# Patient Record
Sex: Male | Born: 1980 | Race: White | Hispanic: Yes | Smoking: Current every day smoker
Health system: Southern US, Community
[De-identification: ages and names within clinical notes are randomized; demographics above are authoritative.]

## PROBLEM LIST (undated history)

## (undated) DIAGNOSIS — K651 Peritoneal abscess: Secondary | ICD-10-CM

## (undated) DIAGNOSIS — Z227 Latent tuberculosis: Secondary | ICD-10-CM

## (undated) HISTORY — PX: NO PAST SURGERIES: SHX2092

---

## 2004-11-14 ENCOUNTER — Emergency Department (HOSPITAL_COMMUNITY): Admission: EM | Admit: 2004-11-14 | Discharge: 2004-11-14 | Payer: Self-pay | Admitting: Emergency Medicine
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
## 2017-11-01 DIAGNOSIS — Z227 Latent tuberculosis: Secondary | ICD-10-CM

## 2017-11-01 HISTORY — DX: Latent tuberculosis: Z22.7

## 2018-04-03 DIAGNOSIS — K651 Peritoneal abscess: Secondary | ICD-10-CM

## 2018-04-03 HISTORY — DX: Peritoneal abscess: K65.1

## 2018-04-12 ENCOUNTER — Inpatient Hospital Stay (HOSPITAL_COMMUNITY)
Admission: EM | Admit: 2018-04-12 | Discharge: 2018-04-16 | DRG: 153 | Disposition: A | Discharge status: Home / Self Care | Payer: Self-pay | Source: Other Acute Inpatient Hospital | Attending: Family Medicine | Admitting: Family Medicine

## 2018-04-12 ENCOUNTER — Encounter (HOSPITAL_COMMUNITY): Payer: Self-pay | Admitting: Emergency Medicine

## 2018-04-12 ENCOUNTER — Emergency Department (HOSPITAL_COMMUNITY): Payer: Self-pay

## 2018-04-12 ENCOUNTER — Other Ambulatory Visit: Payer: Self-pay

## 2018-04-12 DIAGNOSIS — K651 Peritoneal abscess: Secondary | ICD-10-CM

## 2018-04-12 DIAGNOSIS — Z23 Encounter for immunization: Secondary | ICD-10-CM

## 2018-04-12 DIAGNOSIS — J36 Peritonsillar abscess: Principal | ICD-10-CM | POA: Diagnosis present

## 2018-04-12 DIAGNOSIS — F17210 Nicotine dependence, cigarettes, uncomplicated: Secondary | ICD-10-CM | POA: Diagnosis present

## 2018-04-12 DIAGNOSIS — D72829 Elevated white blood cell count, unspecified: Secondary | ICD-10-CM | POA: Diagnosis not present

## 2018-04-12 DIAGNOSIS — M542 Cervicalgia: Secondary | ICD-10-CM | POA: Diagnosis not present

## 2018-04-12 DIAGNOSIS — M272 Inflammatory conditions of jaws: Secondary | ICD-10-CM | POA: Diagnosis present

## 2018-04-12 DIAGNOSIS — R0789 Other chest pain: Secondary | ICD-10-CM | POA: Diagnosis not present

## 2018-04-12 HISTORY — DX: Peritoneal abscess: K65.1

## 2018-04-12 HISTORY — DX: Latent tuberculosis: Z22.7

## 2018-04-12 LAB — BASIC METABOLIC PANEL
ANION GAP: 11 (ref 5–15)
BUN: 18 mg/dL (ref 6–20)
CALCIUM: 9.4 mg/dL (ref 8.9–10.3)
CO2: 21 mmol/L — ABNORMAL LOW (ref 22–32)
Chloride: 106 mmol/L (ref 98–111)
Creatinine, Ser: 1.14 mg/dL (ref 0.61–1.24)
GFR calc Af Amer: >60 mL/min (ref >60)
GFR calc non Af Amer: >60 mL/min (ref >60)
GLUCOSE: 116 mg/dL — AB (ref 70–99)
Potassium: 4 mmol/L (ref 3.5–5.1)
Sodium: 138 mmol/L (ref 135–145)

## 2018-04-12 LAB — CBC WITH DIFFERENTIAL/PLATELET
Abs Immature Granulocytes: 0.1 10*3/uL (ref 0.0–0.1)
BASOS ABS: 0.1 10*3/uL (ref 0.0–0.1)
BASOS PCT: 0 %
EOS ABS: 0 10*3/uL (ref 0.0–0.7)
Eosinophils Relative: 0 %
HCT: 48.6 % (ref 39.0–52.0)
Hemoglobin: 16.7 g/dL (ref 13.0–17.0)
IMMATURE GRANULOCYTES: 0 %
Lymphocytes Relative: 11 %
Lymphs Abs: 1.4 10*3/uL (ref 0.7–4.0)
MCH: 30.9 pg (ref 26.0–34.0)
MCHC: 34.4 g/dL (ref 30.0–36.0)
MCV: 90 fL (ref 78.0–100.0)
MONOS PCT: 3 %
Monocytes Absolute: 0.4 10*3/uL (ref 0.1–1.0)
NEUTROS PCT: 86 %
Neutro Abs: 11.1 10*3/uL — ABNORMAL HIGH (ref 1.7–7.7)
Platelets: 315 10*3/uL (ref 150–400)
RBC: 5.4 MIL/uL (ref 4.22–5.81)
RDW: 11.9 % (ref 11.5–15.5)
WBC: 13 10*3/uL — ABNORMAL HIGH (ref 4.0–10.5)

## 2018-04-12 MED ORDER — SODIUM CHLORIDE 0.9 % IV BOLUS
1000.0000 mL | Freq: Once | INTRAVENOUS | Status: AC
  Administered 2018-04-12: 1000 mL via INTRAVENOUS

## 2018-04-12 MED ORDER — ENOXAPARIN SODIUM 40 MG/0.4ML ~~LOC~~ SOLN
40.0000 mg | SUBCUTANEOUS | Status: DC
  Administered 2018-04-12 – 2018-04-15 (×3): 40 mg via SUBCUTANEOUS
  Filled 2018-04-12 (×3): qty 0.4

## 2018-04-12 MED ORDER — DEXAMETHASONE SODIUM PHOSPHATE 10 MG/ML IJ SOLN
10.0000 mg | Freq: Four times a day (QID) | INTRAMUSCULAR | Status: AC
  Administered 2018-04-12 – 2018-04-13 (×4): 10 mg via INTRAVENOUS
  Filled 2018-04-12 (×4): qty 1

## 2018-04-12 MED ORDER — ONDANSETRON HCL 4 MG PO TABS: 4.0000 mg | ORAL_TABLET | Freq: Four times a day (QID) | ORAL | Status: DC | PRN

## 2018-04-12 MED ORDER — ACETAMINOPHEN 325 MG PO TABS: 650.0000 mg | ORAL_TABLET | Freq: Four times a day (QID) | ORAL | Status: DC | PRN

## 2018-04-12 MED ORDER — SODIUM CHLORIDE 0.9 % IV SOLN
INTRAVENOUS | Status: DC
  Administered 2018-04-12 – 2018-04-14 (×5): via INTRAVENOUS

## 2018-04-12 MED ORDER — INFLUENZA VAC SPLIT QUAD 0.5 ML IM SUSY
0.5000 mL | PREFILLED_SYRINGE | INTRAMUSCULAR | Status: AC
  Administered 2018-04-14: 0.5 mL via INTRAMUSCULAR
  Filled 2018-04-12: qty 0.5

## 2018-04-12 MED ORDER — IBUPROFEN 600 MG PO TABS
600.0000 mg | ORAL_TABLET | Freq: Four times a day (QID) | ORAL | Status: DC | PRN
  Administered 2018-04-12 – 2018-04-13 (×2): 600 mg via ORAL
  Filled 2018-04-12 (×2): qty 1

## 2018-04-12 MED ORDER — IOHEXOL 300 MG/ML  SOLN
75.0000 mL | Freq: Once | INTRAMUSCULAR | Status: AC | PRN
  Administered 2018-04-12: 75 mL via INTRAVENOUS

## 2018-04-12 MED ORDER — CLINDAMYCIN PHOSPHATE 600 MG/50ML IV SOLN
600.0000 mg | Freq: Three times a day (TID) | INTRAVENOUS | Status: DC
  Administered 2018-04-12 – 2018-04-16 (×11): 600 mg via INTRAVENOUS
  Filled 2018-04-12 (×14): qty 50

## 2018-04-12 MED ORDER — POLYETHYLENE GLYCOL 3350 17 G PO PACK
17.0000 g | PACK | Freq: Every day | ORAL | Status: DC | PRN
  Administered 2018-04-13: 17 g via ORAL
  Filled 2018-04-12 (×2): qty 1

## 2018-04-12 MED ORDER — DEXAMETHASONE SODIUM PHOSPHATE 10 MG/ML IJ SOLN
10.0000 mg | Freq: Once | INTRAMUSCULAR | Status: AC
  Administered 2018-04-12: 10 mg via INTRAVENOUS
  Filled 2018-04-12: qty 1

## 2018-04-12 MED ORDER — ACETAMINOPHEN 650 MG RE SUPP: 650.0000 mg | Freq: Four times a day (QID) | RECTAL | Status: DC | PRN

## 2018-04-12 MED ORDER — LIDOCAINE-EPINEPHRINE 1 %-1:100000 IJ SOLN
10.0000 mL | Freq: Once | INTRAMUSCULAR | Status: AC
  Administered 2018-04-12: 10 mL
  Filled 2018-04-12: qty 10

## 2018-04-12 MED ORDER — CLINDAMYCIN PHOSPHATE 900 MG/50ML IV SOLN
900.0000 mg | Freq: Once | INTRAVENOUS | Status: AC
  Administered 2018-04-12: 900 mg via INTRAVENOUS
  Filled 2018-04-12: qty 50

## 2018-04-12 MED ORDER — ONDANSETRON HCL 4 MG/2ML IJ SOLN
4.0000 mg | Freq: Four times a day (QID) | INTRAMUSCULAR | Status: DC | PRN
  Administered 2018-04-16: 4 mg via INTRAVENOUS
  Filled 2018-04-12: qty 2

## 2018-04-12 NOTE — ED Notes (Signed)
ENT at bedside

## 2018-04-12 NOTE — ED Notes (Signed)
Patient transported to CT 

## 2018-04-12 NOTE — ED Provider Notes (Signed)
[facility] EMERGENCY DEPARTMENT Provider Note   CSN: [id number] Arrival date & time: 04/12/18  6578     History   Chief Complaint Chief Complaint  Patient presents with  . Peritonsalor Abscess    HPI Todd Gray is a 37 y.o. male.  Patient was brought in by [facility] from [facility] rocking him the [facility] in [place].  Patient had been accepted by Dr. Jearld Fenton from ear nose and throat for peritonsillar abscess.  Patient has had symptoms for 3 days to include fever sore throat difficulty speaking.  Patient arrived stable from the transfer.     History reviewed. No pertinent past medical history.  There are no active problems to display for this patient.   History reviewed. No pertinent surgical history.      Home Medications    Prior to Admission medications   Not on File    Family History No family history on file.  Social History Social History   Tobacco Use  . Smoking status: Current Every Day Smoker  . Smokeless tobacco: Never Used  Substance Use Topics  . Alcohol use: Yes  . Drug use: Not on file     Allergies   Patient has no known allergies.   Review of Systems Review of Systems  Constitutional: Positive for fever.  HENT: Positive for sore throat and trouble swallowing. Negative for dental problem.   Eyes: Negative for redness.  Respiratory: Negative for shortness of breath.   Cardiovascular: Negative for chest pain.  Gastrointestinal: Negative for abdominal pain.  Genitourinary: Negative for dysuria.  Musculoskeletal: Negative for back pain.  Skin: Negative for rash.  Neurological: Negative for headaches.  Hematological: Does not bruise/bleed easily.  Psychiatric/Behavioral: Negative for confusion.     Physical Exam Updated Vital Signs BP 122/79   Pulse 71   Temp 97.9 F (36.6 C) (Oral)   Resp 20   SpO2 96%   Physical Exam  Constitutional: He is oriented to person, place, and time. He appears well-developed and  well-nourished. No distress.  HENT:  Head: Normocephalic and atraumatic.  Swelling to the soft palate on the right side.  Tongue pushed up a little bit on the right side complete examination of the pharynx little difficult.  Uvula does appear shifted towards the left.  Eyes: Pupils are equal, round, and reactive to light. Conjunctivae and EOM are normal.  Neck: Neck supple.  Cardiovascular: Normal rate and regular rhythm.  Pulmonary/Chest: Effort normal and breath sounds normal. No respiratory distress.  Abdominal: Soft. Bowel sounds are normal. There is no tenderness.  Musculoskeletal: Normal range of motion. He exhibits no edema.  Neurological: He is alert and oriented to person, place, and time. No cranial nerve deficit or sensory deficit. He exhibits normal muscle tone. Coordination normal.  Skin: Skin is warm.  Nursing note and vitals reviewed.    ED Treatments / Results  Labs (all labs ordered are listed, but only abnormal results are displayed) Labs Reviewed  CBC WITH DIFFERENTIAL/PLATELET - Abnormal; Notable for the following components:      Result Value   WBC 13.0 (*)    Neutro Abs 11.1 (*)    All other components within normal limits  BASIC METABOLIC PANEL - Abnormal; Notable for the following components:   CO2 21 (*)    Glucose, Bld 116 (*)    All other components within normal limits    EKG None  Radiology Ct Maxillofacial W Contrast  Result Date: 04/12/2018 CLINICAL DATA:  Initial  evaluation for maxillofacial swelling, abscess. EXAM: CT MAXILLOFACIAL WITH CONTRAST TECHNIQUE: Multidetector CT imaging of the maxillofacial structures was performed with intravenous contrast. Multiplanar CT image reconstructions were also generated. CONTRAST:  50mL OMNIPAQUE IOHEXOL 300 MG/ML  SOLN COMPARISON:  None. FINDINGS: Osseous: No acute osseous abnormality within the face. Visualized dentition within normal limits. Orbits: Globes and orbital soft tissues within normal limits.  Sinuses: Left maxillary sinus retention cyst. Paranasal sinuses otherwise clear. Mastoid air cells and middle ear cavities are well pneumatized and free of fluid. Soft tissues: Oral cavity within normal limits. Asymmetric enlargement of the right palatine tonsil, consistent with acute tonsillitis. Irregular hypodense collection measuring 13 x 17 x 14 mm consistent with tonsillar/peritonsillar abscess (series 4, image 33). Additional smaller component posteriorly measures 7 mm (series 4, image 36), likely contiguous. Associated swelling with inflammatory stranding within the adjacent right parapharyngeal fat. No retropharyngeal collection. Left oropharynx within normal limits. Mild asymmetric prominence of right level II lymph nodes, likely reactive. Limited intracranial: Subcentimeter parenchymal calcification at the anterior left frontal lobe. Probable dilated perivascular space at the inferior right basal ganglia. Visualized portions of the brain otherwise unremarkable. IMPRESSION: Findings consistent with acute right tonsillitis. Associated 13 x 17 x 14 mm and 7 mm tonsillar/peritonsillar abscesses as above. Electronically Signed   By: Rise Mu M.D.   On: 04/12/2018 13:20    Procedures Procedures (including critical care time)  Medications Ordered in ED Medications  0.9 %  sodium chloride infusion ( Intravenous New Bag/Given 04/12/18 1133)  lidocaine-EPINEPHrine (XYLOCAINE W/EPI) 1 %-1:100000 (with pres) injection 10 mL (has no administration in time range)  sodium chloride 0.9 % bolus 1,000 mL (0 mLs Intravenous Stopped 04/12/18 1300)  clindamycin (CLEOCIN) IVPB 900 mg (0 mg Intravenous Stopped 04/12/18 1212)  dexamethasone (DECADRON) injection 10 mg (10 mg Intravenous Given 04/12/18 1133)  iohexol (OMNIPAQUE) 300 MG/ML solution 75 mL (75 mLs Intravenous Contrast Given 04/12/18 1304)     Initial Impression / Assessment and Plan / ED Course  I have reviewed the triage vital signs and the  nursing notes.  Pertinent labs & imaging results that were available during my care of the patient were reviewed by me and considered in my medical decision making (see chart for details).     Contacted on-call ear nose and throat.  Dr. Jearld Fenton is no longer on call.  Is Dr. Delight Hoh.  She came in and saw the patient felt that it was more of an odontogenic source and not tonsillar source.  I therefore contacted Dr. drape from oral surgery.  His request since things were now getting confusing about the source did not labs started an IV give some IV fluids and ordered CT maxillofacial with contrast.  Patient's initial CT had been done up in the [place] area.  Showed about a 2 cm abscess that they felt was at the base of the tonsil on the right side and involving the base of the tongue.  Repeat studies patient had similar findings.  Oral surgery felt that it was not tooth related.  ENT recontacted.  ENT drain the abscess they still think it is possibly tooth related.  Patient also was given a dose of Decadron and Cleocin by me when I got oral surgery involved.  ENT has once patient admitted by the the unassigned medicine service.  It appears that this could be family medicine.  They want observation overnight.  Patient has remained stable.  Final Clinical Impressions(s) / ED Diagnoses   Final diagnoses:  Peritoneal abscess [facility])    ED Discharge Orders    None       Vanetta Mulders, MD 04/12/18 1739

## 2018-04-12 NOTE — Consult Note (Addendum)
[facility] OTOLARYNGOLOGY CONSULTATION  Referring Physician: Dr. Deretha Emory Primary Care Physician: Patient, No Pcp Per Patient Location at Initial Consult: Emergency Department Chief Complaint/Reason for Consult: oropharyngeal abscess  History of Presenting Illness:  History obtained from patient, girlfriend, and EMR.  Todd Gray is a  37 y.o. male presenting with throat pain. This began acutely on 04/09/18. No prior tonsillar issues or issues with recurrent strep in the past. He is tolerating his own secretions. No shortness of breath. Is able to lie flat. Has not been on abx. Strep testing at OSH was negative. +febrile at home. +malaise  Here today with girlfriend. [employer] interpreter used for this entire visit.   History reviewed. No pertinent past medical history.  History reviewed. No pertinent surgical history.  No family history on file. No bleeding disorders or anesthesia reactions.   Social History   Socioeconomic History  . Marital status: Married    Spouse name: Not on file  . Number of children: Not on file  . Years of education: Not on file  . Highest education level: Not on file  Occupational History  . Not on file  Social Needs  . Financial resource strain: Not on file  . Food insecurity:    Worry: Not on file    Inability: Not on file  . Transportation needs:    Medical: Not on file    Non-medical: Not on file  Tobacco Use  . Smoking status: Current Every Day Smoker  . Smokeless tobacco: Never Used  Substance and Sexual Activity  . Alcohol use: Yes  . Drug use: Not on file  . Sexual activity: Not on file  Lifestyle  . Physical activity:    Days per week: Not on file    Minutes per session: Not on file  . Stress: Not on file  Relationships  . Social connections:    Talks on phone: Not on file    Gets together: Not on file    Attends religious service: Not on file    Active member of club or organization: Not on file   Attends meetings of clubs or organizations: Not on file    Relationship status: Not on file  Other Topics Concern  . Not on file  Social History Narrative  . Not on file    No current facility-administered medications on file prior to encounter.    No current outpatient medications on file prior to encounter.    No Known Allergies   Review of Systems: Complete, all negative except for above HPI.    OBJECTIVE: Vital Signs: Vitals:   04/12/18 0908 04/12/18 1130  BP: 112/87 122/79  Pulse: 80 71  Resp: 20 20  Temp: 97.9 F (36.6 C)   SpO2: 96% 96%    I&O No intake or output data in the 24 hours ending 04/12/18 1217  Physical Exam General: Well developed, well nourished. No acute distress. Voice strong, no hot potato quality  Head/Face: Normocephalic, atraumatic. No scars or lesions. No sinus tenderness. Facial nerve intact and equal bilaterally.  No facial lacerations. Salivary glands non tender and without palpable masses  Eyes: Globes well positioned, no proptosis Lids: No periorbital edema/ecchymosis. No lid laceration Conjunctiva: No chemosis, hemorrhage PERRl, EOMI  Ears: No gross deformity. Normal external canal. Tympanic membrane intact bilaterally, no MEE  Hearing: Normal speech reception.  Nose: No gross deformity or lesions. No purulent discharge. Septum midline. No turbinate hypertrophy.  Mouth/Oropharynx: Lips without any lesions. Dentition fair, several  molars with fillings. Tenderness along right posterior lingual surface of mandible. No peritonsillar fluctuance. +mild uvular hydrops and soft palate edema.   Uvula midline. Tongue midline without lesions.  Neck: Trachea midline. No masses. No thyromegaly or nodules palpated. No crepitus.  Lymphatic: Mild right submandibular lymphadenopathy in the neck, soft, mildly swollen.  Respiratory: No stridor or distress.  Cardiovascular: Regular rate and rhythm.  Extremities: No edema or cyanosis. Warm and  well-perfused.  Skin: No scars or lesions on face or neck.  Neurologic: CN II-XII intact. Moving all extremities without gross abnormality.  Other:      Labs: Lab Results  Component Value Date   WBC 13.0 (H) 04/12/2018   HGB 16.7 04/12/2018   HCT 48.6 04/12/2018   PLT 315 04/12/2018   NA 138 04/12/2018   K 4.0 04/12/2018   CL 106 04/12/2018   CREATININE 1.14 04/12/2018   BUN 18 04/12/2018   CO2 21 (L) 04/12/2018     Review of Ancillary Data / Diagnostic Tests: CT neck reviewed in Radiology Reading Room, discussed with Radiology Dr. Chestine Spore, and with ED Physician- Dr. Deretha Emory. My interpretation of this study is there is a posterior- lateral tongue fluid collection contiguous with a periapical lucency in a right 3rd mandibular molar and an adjacent subperiosteal abscess.      ASSESSMENT:  36 y.o. male with right odontogenic abscess associated with mandibular third molar.   RECOMMENDATIONS: -IV decadron -IV Clindamycin -Consult OMFS for removal and drainage   OMFS was consulted and did not have any procedures to offer at this time.   Incision and Drainage Procedure Note  Pre-operative Diagnosis: Right oropharyngeal infection  Post-operative Diagnosis: same  Indications: Fluid collection in right base of tongue/tonsil with minimally rim enhancing component adjacent to the right mandibular third molar.   Anesthesia: 1% lidocaine with epinephrine, Hurricaine spray  Procedure Details  The procedure, risks and complications have been discussed in detail (including, but not limited to airway compromise, infection, bleeding) with the patient, and the patient has signed consent to the procedure.  TAfter adequate local anesthesia, needle localization with a 23g was attempted in multiple locations aroudn the tonsillar and peritonsillar region. Purulent drainage: absent The patient was observed until stable.  Findings: No purulence within the tonsil or peritonsillar regions  identified.  EBL: 7 cc's   Condition: Tolerated procedure well and Stable   Complications: none.   Would recommend admitting for observation overnight for fluid resuscitation, antibiotics, and two further doses of 10mg  IV decadron. Clears. Will recheck sometime tomorrow.    Misty Stanley, MD  [facility] Office phone [phone]

## 2018-04-12 NOTE — ED Triage Notes (Signed)
Per [facility] pt transferred from [facility] sent for right sided oral pharyngeal abscess. Given decadron and unasyn prior to arrival. Pt speaks Spanish primarily.

## 2018-04-12 NOTE — ED Notes (Signed)
Pt returned from CT °

## 2018-04-12 NOTE — ED Notes (Signed)
ENT cart at bedside. ENT MD at bedside.

## 2018-04-12 NOTE — Progress Notes (Signed)
Pt arrived to unit from ED with Dx: Acute tonsilitis with abcess formation. Pt Spanish speaking only accompanied by significant other. Pt A&O x4. Complained of throat pain, PRN Ibuprofen given. Skin intact. Oriented to room and call bell.

## 2018-04-12 NOTE — H&P (Addendum)
Family Medicine Teaching [facility] Admission History and Physical Service Pager: [phone]  Patient name: Todd Gray Medical record number: [id number] Date of birth: Jun 06, 1981 Age: 37 y.o. Gender: male  Primary Care Provider: Patient, No Pcp Per Consultants: ENT Code Status:  full  Chief Complaint: Peritonsillar abscess  Assessment and Plan: Todd Gray is a 37 y.o. male who presents with fever throat pain x 3 days with CT consistent with acute tonsillitis with abscess formation. No significant past medical history.  #Acute tonsillitis with abscess formation  Patient CT showing acute right sided acute tonsillitis with abscess formation. In ED, Clindamycin 900mg  x1 and dexamethasone 10mg  x1 with improvement. ENT was consulted and attempted to drain the lesion without success.  Some concern for involvement of mandibular third molar however per ENT note OMFS was consulted and did not have any procedures to offer at this time.  Patient is protecting his airway well and is able to tolerate his own secretions.  Has a slight leukocytosis with WBC 13.  No concern for sepsis given stable vitals  ENT to see patient tomorrow morning, appreciate recommendations  Continue IV clindamycin 600 mg every 8 hours (9/10-)  Decadron 10 mg every 6 hours x 4 doses.    MIVF NS@100mL  per hour as patient is having difficulty swallowing. With   Clear liquids diet  Pain control with prn Tylenol and prn ibuprofen  AM CBC and CMP  F: NS 11mL/hr E: No repletion necessary N: Clears only  GI ppx: none DVT Prophylaxis: Lovenox overnight.   Disposition: Med surg  History of the Present Illness  Todd Gray is a 37 y.o. male current smoker presenting with peritonsillar abscess.  Patient reports that symptoms started on Saturday(04/09/18) night with a mild sore throat.  He had noticed some swelling starting on Sunday (04/10/18) with increased pain and first fever of 101.1 F.  At first, he was having pain with  swallowing and difficulty eating.  He reports that the pain is dull with radiation up to his ear.  He reports that he has not eaten anything since Sunday and has been drinking less due to the pain.  Today, he rates the pain 7 out of 10.  His fever has continued and was 101.3 this morning.  He reports that it is difficult to open his mouth widely.  He also reports that he has been having a productive cough with greenish sputum that started on Sunday with the onset of throat swelling. He denies any changes in his hearing, recent illnesses, previous oral abscess, or any recent upper respiratory illness.  Patient denies any drainage from the area, history of tonsillar stones, and any recent tooth pain or cavities.  The last time he was at the dentist was 3 years ago.  Went to [facility] ED last night around 12:30 PM and was transferred to [facility] at 930 this morning.  Initial CBC and BMP were significant for white count of 13.0 with ANC of 11.1K/uL.  He was given 1 L bolus sodium chloride and clindamycin 900 mg x 1 and dexamethasone 10 mg x 1.  He received a CT maxillofacial with contrast which was significant for findings of acute right tonsillitis with 14 x 17 x 14 mm and 7 mm tonsillar/peritonsillar abscesses. ENT was consulted and attempted to drain the abscess in the ED without success.  ED physician reports that he appears much improved since arriving this morning after fluid resuscitation and antibiotics.  Review Of Systems: Per HPI with the  following additions:  Review of Systems  Constitutional: Positive for fever. Negative for chills and malaise/fatigue.  HENT: Positive for ear pain and sore throat. Negative for congestion.   Eyes: Negative for blurred vision and double vision.  Respiratory: Positive for cough and sputum production. Negative for shortness of breath and wheezing.   Cardiovascular: Negative for chest pain.  Gastrointestinal: Negative for abdominal pain, nausea and vomiting.  Skin:  Negative for rash.  Neurological: Negative for dizziness, weakness and headaches.   Past Medical History: Past Medical History:  Diagnosis Date  . Latent tuberculosis    by blood test, had CXR that showed it was latent.    Past Surgical History: History reviewed. No pertinent surgical history.  Social History: Additional social history: immigrated here 18 years ago. Lives at home with girlfriend and children. Children have not been sick recently. Works on Environmental [employer]. No alcohol. Smokes 2 cigarettes/day for past 15 years. Used to use chewing tobacco for 1 month about 3 years ago. Past history of drug use significant for cocaine, last used 1 year ago.  Please also refer to relevant sections of EMR.  Family History: No family history of diabetes or high blood pressure.  Allergies and Medications: No Known Allergies Patient does not take any medications prior to admission.  Objective: BP 122/79   Pulse 71   Temp 97.9 F (36.6 C) (Oral)   Resp 20   SpO2 96%  Exam: Gen: NAD, alert, pale, mildly diaphoretic, well-nourished, ill-appearing.  His voice is muffled when speaking. HEENT: Normocephaic, atraumatic. PERRLA, clear conjuctiva, no scleral icterus and injection. Normal EOM.  Hearing intact. TM pearly grey bilaterally with no fluid.  Neck supple with tenderness to palpation in right anterior triangle.1-1.5 inch diameter swelling just anterior to the mandibular ridge.  No other lymphadenopathy appreciated. Nares patent with no discharge. Patient has difficulty opening mouth widely.  Patient expresses pain when tongue depressor is used on posterior parts of the tongue.  There is purple-ish discorlored, circular lesion with irregular borders about 1.5" in diameter in the area of the glossopharyngeal arch and pharyngeal tonsil.  There appears there is active bleeding from the site.  Remainder of oropharynx appears unremarkable.  There is no swelling or erythema in the left tonsil.    CV: Regular rate and rhythm.  Normal S1-S2.  No murmur, gallops, rubs appreciated.  Normal capillary refill bilaterally.  Radial pulses 2+ bilaterally. No bilateral lower extremity edema. Resp: Patient is protecting his airway.  Clear to auscultation bilaterally.  No wheezing, rales, rhonchi, or other abnormal lung sounds.  No increased work of breathing appreciated. Abd: Nontender and nondistended on palpation to all 4 quadrants.  Positive bowel sounds. Skin: No obvious rashes, lesions, or trauma.  Normal turgor.  MSK: Normal ROM. Normal strength and tone.  Neuro: Cranial nerves II through VI grossly intact.  No gross abnormalities appreciated. Psych: Cooperative with exam.  Pleasant. Makes good eye contact. Genitourinary: deferred.    Labs and Imaging: CBC BMET  Recent Labs  Lab 04/12/18 1121  WBC 13.0*  HGB 16.7  HCT 48.6  PLT 315   Recent Labs  Lab 04/12/18 1121  NA 138  K 4.0  CL 106  CO2 21*  BUN 18  CREATININE 1.14  GLUCOSE 116*  CALCIUM 9.4      Imaging/Diagnostic Tests: Ct Maxillofacial W Contrast Result Date: 04/12/2018 Asymmetric enlargement of the right palatine tonsil, consistent with acute tonsillitis. Irregular hypodense collection measuring 13 x 17 x 14 mm  consistent with tonsillar/peritonsillar abscess (series 4, image 33). Additional smaller component posteriorly measures 7 mm (series 4, image 36), likely contiguous. Associated swelling with inflammatory stranding within the adjacent right parapharyngeal fat. No retropharyngeal collection. Left oropharynx within normal limits. Mild asymmetric prominence of right level II lymph nodes, likely reactive. Limited intracranial: Subcentimeter parenchymal calcification at the anterior left frontal lobe. Probable dilated perivascular space at the inferior right basal ganglia. Visualized portions of the brain otherwise unremarkable. IMPRESSION: Findings consistent with acute right tonsillitis. Associated 13 x 17 x 14 mm  and 7 mm tonsillar/peritonsillar abscesses as above. Electronically Signed   By: Rise Mu M.D.   On: 04/12/2018 13:20     Melene Plan, MD 04/12/2018, 5:36 PM PGY-1, [place] [facility] FPTS Intern pager: [phone], text pages welcome  FPTS Upper-Level Resident Addendum  I have independently interviewed and examined the patient. I have discussed the above with the original author and agree with their documentation. My edits for correction/addition/clarification are in blue. Please see also any attending notes.   Leland Her, DO PGY-3, [place] [facility] 04/12/2018 7:53 PM  FPTS Service pager: [phone] (text pages welcome through AMION)

## 2018-04-13 ENCOUNTER — Encounter (HOSPITAL_COMMUNITY): Payer: Self-pay | Admitting: General Practice

## 2018-04-13 ENCOUNTER — Inpatient Hospital Stay (HOSPITAL_COMMUNITY): Payer: Self-pay

## 2018-04-13 DIAGNOSIS — M272 Inflammatory conditions of jaws: Secondary | ICD-10-CM

## 2018-04-13 LAB — COMPREHENSIVE METABOLIC PANEL
ALBUMIN: 3.5 g/dL (ref 3.5–5.0)
ALK PHOS: 90 U/L (ref 38–126)
ALT: 17 U/L (ref 0–44)
ANION GAP: 10 (ref 5–15)
AST: 14 U/L — ABNORMAL LOW (ref 15–41)
BUN: 17 mg/dL (ref 6–20)
CALCIUM: 8.9 mg/dL (ref 8.9–10.3)
CO2: 20 mmol/L — AB (ref 22–32)
CREATININE: 0.95 mg/dL (ref 0.61–1.24)
Chloride: 109 mmol/L (ref 98–111)
GFR calc Af Amer: >60 mL/min (ref >60)
GFR calc non Af Amer: >60 mL/min (ref >60)
GLUCOSE: 136 mg/dL — AB (ref 70–99)
Potassium: 4 mmol/L (ref 3.5–5.1)
SODIUM: 139 mmol/L (ref 135–145)
Total Bilirubin: 0.9 mg/dL (ref 0.3–1.2)
Total Protein: 6.8 g/dL (ref 6.5–8.1)

## 2018-04-13 LAB — CBC
HCT: 43 % (ref 39.0–52.0)
Hemoglobin: 15.3 g/dL (ref 13.0–17.0)
MCH: 31.4 pg (ref 26.0–34.0)
MCHC: 35.6 g/dL (ref 30.0–36.0)
MCV: 88.1 fL (ref 78.0–100.0)
Platelets: 346 10*3/uL (ref 150–400)
RBC: 4.88 MIL/uL (ref 4.22–5.81)
RDW: 11.5 % (ref 11.5–15.5)
WBC: 13.9 10*3/uL — ABNORMAL HIGH (ref 4.0–10.5)

## 2018-04-13 LAB — RAPID URINE DRUG SCREEN, HOSP PERFORMED
Amphetamines: NOT DETECTED
Barbiturates: NOT DETECTED
Benzodiazepines: NOT DETECTED
Cocaine: NOT DETECTED
OPIATES: NOT DETECTED
TETRAHYDROCANNABINOL: NOT DETECTED

## 2018-04-13 LAB — HIV ANTIBODY (ROUTINE TESTING W REFLEX): HIV SCREEN 4TH GENERATION: NONREACTIVE

## 2018-04-13 MED ORDER — KETOROLAC TROMETHAMINE 60 MG/2ML IM SOLN
30.0000 mg | Freq: Four times a day (QID) | INTRAMUSCULAR | Status: DC | PRN
  Filled 2018-04-13: qty 2

## 2018-04-13 MED ORDER — MORPHINE SULFATE (PF) 2 MG/ML IV SOLN
2.0000 mg | INTRAVENOUS | Status: AC
  Administered 2018-04-13: 2 mg via INTRAVENOUS
  Filled 2018-04-13: qty 1

## 2018-04-13 MED ORDER — KETOROLAC TROMETHAMINE 30 MG/ML IJ SOLN
30.0000 mg | Freq: Four times a day (QID) | INTRAMUSCULAR | Status: DC | PRN
  Filled 2018-04-13: qty 1

## 2018-04-13 MED ORDER — MORPHINE SULFATE (PF) 2 MG/ML IV SOLN
2.0000 mg | INTRAVENOUS | Status: DC | PRN
  Administered 2018-04-13: 2 mg via INTRAVENOUS
  Filled 2018-04-13 (×2): qty 1

## 2018-04-13 MED ORDER — ALUM & MAG HYDROXIDE-SIMETH 200-200-20 MG/5ML PO SUSP
30.0000 mL | ORAL | Status: DC | PRN
  Administered 2018-04-13 – 2018-04-14 (×2): 30 mL via ORAL
  Filled 2018-04-13 (×2): qty 30

## 2018-04-13 MED ORDER — MORPHINE SULFATE (PF) 2 MG/ML IV SOLN
2.0000 mg | INTRAVENOUS | Status: DC | PRN
  Administered 2018-04-13 – 2018-04-16 (×8): 2 mg via INTRAVENOUS
  Filled 2018-04-13 (×8): qty 1

## 2018-04-13 NOTE — Progress Notes (Signed)
ENT Consult Progress Note  Subjective: Did well overnight.  Tolerating secretions, ate chicken broth. No shortness of breath.  States he is still in a lot of pain.  Afebrile.    Objective: Vitals:   04/12/18 2024 04/13/18 0327  BP: 118/87 110/76  Pulse: 83 62  Resp: 20 20  Temp: 97.8 F (36.6 C) (!) 97.5 F (36.4 C)  SpO2: 97% 98%    Physical Exam: CONSTITUTIONAL: well developed, nourished, no distress and alert and oriented x 3.  Continues to appear rather uncomfortable CARDIOVASCULAR: normal rate and regular rhythm PULMONARY/CHEST WALL: effort normal and no stridor, no stertor, no dysphonia HENT: Head : normocephalic and atraumatic Ears: Right ear:   canal normal, external ear normal and hearing normal Left ear:   canal normal, external ear normal and hearing normal Nose: nose normal and no purulence Mouth/Throat:  Mouth: uvula midline and with less swelling and no oral lesions Throat: right tonsil slightly enlarged, no palpable fluctuance. No ulcerations. Small bruise from attempted needle decompression, perhaps slight decreased in peritonsillar edema. Floor of mouth remains soft Improved trisumus Mucous membranes: normal EYES: conjunctiva normal, EOM normal and PERRL NECK: supple, trachea normal and no thyromegaly or cervical LAD    Data Review/Consults/Discussions:  Reviewed Fm notes. Clinically improving.   WBC 13 (did recive IV decadron) Panorex today- no acute dental infection noted  Assessment: Todd Gray 37 y.o. male who presents with oropharyngeal infection. CT scan done at OSH was most consistent with a fluid collection contiguous with right mandibular third molar and was not in a typical peritonsillar or intratonsillar location. CT performed here a few hours later (after receiving IV Unasyn and decadron) demonstrated peritonsillar swelling, but again this was very very low and not in a typical PTA location at all. There was no rim-enhancement on  this scan. Maximum dimensions were 1.4cm. Since yesterday, he has improved trismus, soft palate and uvula less swollen, less pain with palpation. I am optimistic this represents true clinical response and not temporary improvement from steroids. Will continue to monitor him. No airway concerns at this time.    Plan:  -Continue IV clinda -Patient should have completed three rounds of IV Decadron 10mg  by now -Soft diet as tolerated -Will reassess tomorrow for any changes. Often in this setting it can take 48 hours for an abscess to truly declare itself.  -Appreciate management and care assistance by the [facility] Medicine service.    Thank you for involving [facility] in the care of this patient. Should you need further assistance, please call our office at [phone].    Misty Stanley, MD

## 2018-04-13 NOTE — Progress Notes (Signed)
Family Medicine Teaching Service Daily Progress Note Intern Pager: [phone]  Patient name: Todd Gray Medical record number: [id number] Date of birth: 1980-11-26 Age: 37 y.o. Gender: male  Primary Care Provider: Patient, No Pcp Per Consultants: ENT, OMFS FYI Code Status: Full code  Pt Overview and Major Events to Date:  Hospital Day 0 Admitted: 04/12/2018 04/12/18: ENT drain- unsuccessful  Assessment and Plan: Todd Gray is a 37 y.o. male who presents with fever throat pain x 3 days with CT consistent with acute tonsillitis with abscess formation. No significant past medical history.  #Acute tonsillitis with abscess formation  Peritonsillar abscess vs.  Periodontal abscess. Patient continues to have pain and trouble drinking today.On physical exam, purple lesion is slightly smaller and present on the glossopharyngeal arch.  Tonsils are visible behind arch and appear symmetrical.  There is some soft palate swelling but not erythematous. WBC 13>13.9 this AM. ENT will continue to follow, appreciate recs.   Pantogram ordered this morning negative for dental abscess. Unerupted molars in bilateral mandibles and in left maxilla.   morphine x1 this am per Dr. Perley Jain. IV Toradol for moderate-severe pain. Tylenol and ibuprofen for mild pain.  Decadron x5 since admission. Last dose at noon.   Continue clindamycin 600 mg.  F: NS 171mL/hr E: No repletion necessary N: Clears only  GI ppx: none DVT Prophylaxis: Lovenox overnight.   Disposition: home, pending improvement/stablization of abscess   PRN Meds: acetaminophen **OR** acetaminophen, ibuprofen, morphine injection, ondansetron **OR** ondansetron (ZOFRAN) IV, polyethylene glycol  ================================================= ================================================= Subjective:  Patient is still on the same amount of pain this morning.  He was able to sleep a little bit through the night.  He is still able to protect  his airway.  Patient had night sweats, which usually occurs at home, but had spent through his gown.  His girlfriend is at bedside and says he typically does not sweat this much during the night.  He also reports that he is feeling weak today.  Objective: Temp:  [97.5 F (36.4 C)-97.9 F (36.6 C)] 97.5 F (36.4 C) (09/11 0327) Pulse Rate:  [62-83] 62 (09/11 0327) Resp:  [15-20] 20 (09/11 0327) BP: (110-122)/(76-87) 110/76 (09/11 0327) SpO2:  [96 %-98 %] 98 % (09/11 0327) Intake/Output 09/10 0701 - 09/11 0700 In: 2172.7 [P.O.:357; I.V.:1615.7; IV Piggyback:200] Out: 480 [Urine:480]   Physical Exam:  Gen: NAD, alert, non-toxic, mildly ill-appearing, laying in bed, appears uncomfortable.  Patient appears pale, mildly diaphoretic.  No rigors. HEENT: Normocephaic, atraumatic. Clear conjuctiva, no scleral icterus and injection.  Mouth circular shaped purple discolored lesion appreciated at anterior glossopharyngeal arch.  Today, appears to be slightly smaller.  Approximately three quarters of an inch in diameter.  Tonsils are appreciated behind arch and symmetrical.  Third mandibular molar appears to be growing horizontally towards his second mandibular molar.  There is no obvious abscess on either side of the patient does not have any tenderness to do on tongue depressor at enamel surface of mandibular molars.  Patient continues to be exquisitely tender to palpation to posterior surface of tongue. Neck: Right side of neck continues to be tender to palpation.  Tenderness is most exquisite at the halfway point of right side of mandibular body.  CV: Regular rate and rhythm.  Normal S1-S2.   Normal capillary refill bilaterally.  Radial pulses 2+ bilaterally. No bilateral lower extremity edema. Resp: Clear to auscultation bilaterally.  No wheezing, rales, abnormal lung sounds.  No increased work of breathing appreciated. Abd: Nontender and  nondistended on palpation to all 4 quadrants.  Positive bowel  sounds. Psych: Cooperative with exam. Pleasant. Makes eye contact.  Laboratory: Recent Labs  Lab 04/12/18 1121 04/13/18 0530  WBC 13.0* 13.9*  HGB 16.7 15.3  HCT 48.6 43.0  PLT 315 346   Recent Labs  Lab 04/12/18 1121 04/13/18 0530  NA 138 139  K 4.0 4.0  CL 106 109  CO2 21* 20*  BUN 18 17  CREATININE 1.14 0.95  CALCIUM 9.4 8.9  PROT  --  6.8  BILITOT  --  0.9  ALKPHOS  --  90  ALT  --  17  AST  --  14*  GLUCOSE 116* 136*   Future labs  CBC tomorrow   Imaging/Diagnostic Tests: Ct Maxillofacial W Contrast Result Date: 04/12/2018  IMPRESSION: Findings consistent with acute right tonsillitis. Associated 13 x 17 x 14 mm and 7 mm tonsillar/peritonsillar abscesses as above. Electronically Signed   By: Rise Mu M.D.   On: 04/12/2018 13:20      Melene Plan, MD 04/13/2018, 8:16 AM PGY-1, [facility] Family Medicine FPTS Intern pager: [phone], text pages welcome

## 2018-04-14 ENCOUNTER — Inpatient Hospital Stay (HOSPITAL_COMMUNITY): Payer: Self-pay

## 2018-04-14 DIAGNOSIS — M272 Inflammatory conditions of jaws: Secondary | ICD-10-CM | POA: Diagnosis present

## 2018-04-14 LAB — CBC WITH DIFFERENTIAL/PLATELET
ABS IMMATURE GRANULOCYTES: 0.1 10*3/uL (ref 0.0–0.1)
BASOS ABS: 0 10*3/uL (ref 0.0–0.1)
Basophils Relative: 0 %
Eosinophils Absolute: 0 10*3/uL (ref 0.0–0.7)
Eosinophils Relative: 0 %
HCT: 39.6 % (ref 39.0–52.0)
HEMOGLOBIN: 13.5 g/dL (ref 13.0–17.0)
IMMATURE GRANULOCYTES: 1 %
LYMPHS PCT: 12 %
Lymphs Abs: 2.3 10*3/uL (ref 0.7–4.0)
MCH: 30.5 pg (ref 26.0–34.0)
MCHC: 34.1 g/dL (ref 30.0–36.0)
MCV: 89.6 fL (ref 78.0–100.0)
MONO ABS: 1.3 10*3/uL — AB (ref 0.1–1.0)
Monocytes Relative: 7 %
Neutro Abs: 15.9 10*3/uL — ABNORMAL HIGH (ref 1.7–7.7)
Neutrophils Relative %: 80 %
Platelets: 333 10*3/uL (ref 150–400)
RBC: 4.42 MIL/uL (ref 4.22–5.81)
RDW: 11.7 % (ref 11.5–15.5)
WBC: 19.6 10*3/uL — ABNORMAL HIGH (ref 4.0–10.5)

## 2018-04-14 MED ORDER — IOHEXOL 300 MG/ML  SOLN
75.0000 mL | Freq: Once | INTRAMUSCULAR | Status: AC | PRN
  Administered 2018-04-14: 75 mL via INTRAVENOUS

## 2018-04-14 MED ORDER — KETOROLAC TROMETHAMINE 30 MG/ML IJ SOLN
30.0000 mg | Freq: Four times a day (QID) | INTRAMUSCULAR | Status: DC | PRN
  Administered 2018-04-14 – 2018-04-15 (×6): 30 mg via INTRAVENOUS
  Filled 2018-04-14 (×5): qty 1

## 2018-04-14 MED ORDER — SODIUM CHLORIDE 0.9 % IV SOLN
INTRAVENOUS | Status: DC
  Administered 2018-04-14: 16:00:00 via INTRAVENOUS

## 2018-04-14 NOTE — Progress Notes (Signed)
Family Medicine Teaching Service Daily Progress Note Intern Pager: [phone]  Patient name: Todd HanksHugo Gray Medical record number: [id number] Date of birth: 08-31-1980 Age: 37 y.o. Gender: male  Primary Care Provider: Patient, No Pcp Per Consultants: ENT Code Status: Full code  Pt Overview and Major Events to Date:  Hospital Day 1 Admitted: 04/12/2018 9/10 ENT attempt to drain abscess  Assessment and Gray: Todd GuiseHugo Gomezis a 37 y.o.malewho presents with fever throat pain x 3 days with CT consistent with acute tonsillitis with abscess formation.No significant past medical history.  #Acute tonsillitis with abscess formation  WBC 19.6 < 13.9 ANC 1.3K, AMC 1.3. Vitals and PO intake improving.  Pantogram ordered this morning negative for dental abscess. Unerupted molars in bilateral mandibles and in left maxilla.   Rx  Continue clindamycin 600 mg IV. Can convert to PO once patient tolers PO well.    Morphine PRN Severe pain. IV Toradol for moderate-severe pain. Tylenol for mild pain.  ENT recs  Continue IV clinda. May take a few days for abscess to form in order for draining   Patient's white count is increasing despite IV antibiotic treatment x 3 days. Can be seen clinically with cellulitis and abscess formation. But given no culture, low threshold to change ABx treatment. Possible bacteremia? Blood culture?   Perhaps with ANC increasing, increase ability to drain?   Acute drop in Hgb:  15.3 > 13.5  Follow on CBC  F:Saline Lock  E:none N:soft GI ppx: PEG DVT Prophylaxis:Lovenox   Disposition: home, pending improvement/stablization of abscess   Medications: Scheduled Meds: . enoxaparin (LOVENOX) injection  40 mg Subcutaneous Q24H  . Influenza vac split quadrivalent PF  0.5 mL Intramuscular Tomorrow-1000   Continuous Infusions: . sodium chloride 100 mL/hr at 04/14/18 0515  . clindamycin (CLEOCIN) IV 600 mg (04/14/18 0515)   PRN Meds: acetaminophen **OR**  acetaminophen, alum & mag hydroxide-simeth, ibuprofen, ketorolac, morphine injection, ondansetron **OR** ondansetron (ZOFRAN) IV, polyethylene glycol  ================================================= ================================================= Subjective:  Patient reports doing well over night. He slept well. His pain is about the same, the morphine is not really helping. He is eating because he is hungry, but it is making the area feel worse. Swallowing is difficult. He was able to tolerate > 1L in fluids PO. He had a small BM yesterday but asked for mirilax overnight. This morning he has lower stomach cramping that resolves within <5seconds.   Objective: Temp:  [97.4 F (36.3 C)-98.2 F (36.8 C)] 97.4 F (36.3 C) (09/12 0534) Pulse Rate:  [68-74] 68 (09/12 0534) Resp:  [17-18] 17 (09/11 2224) BP: (118-122)/(73-81) 119/81 (09/12 0534) SpO2:  [96 %-99 %] 99 % (09/12 0534) Intake/Output 09/11 0701 - 09/12 0700 In: 3709.8 [P.O.:1320; I.V.:2239.8; IV Piggyback:150] Out: 850 [Urine:850]   Physical Exam:  Gen: NAD, alert, non-toxic, well-appearing. Not as pale as yesterday.  HEENT: Normocephaic, atraumatic. Clear conjuctiva, no scleral icterus and injection.  Neck: TTP about an inch lower than yesterday's exam. No nodularity  Mouth: right soft palate swollen with assymetric uvula. Swelling appreciated at right tonsillar area.  CV: Regular rate and rhythm.  Normal S1-S2.   Normal capillary refill bilaterally.  Radial pulses 2+ bilaterally. No bilateral lower extremity edema. Resp: Clear to auscultation bilaterally.  No wheezing, rales, abnormal lung sounds.  No increased work of breathing appreciated. Abd: Nontender and nondistended on palpation to all 4 quadrants.  Positive bowel sounds. Psych: Cooperative with exam. Pleasant. Makes eye contact.  Laboratory: Recent Labs  Lab 04/12/18 1121 04/13/18 0530 04/14/18  0415  WBC 13.0* 13.9* 19.6*  HGB 16.7 15.3 13.5  HCT 48.6 43.0  39.6  PLT 315 346 333   Future Labs:  Daily CBC + BMP tom  Imaging/Diagnostic Tests: Dg Orthopantogram Result Date: 04/13/2018 CLINICAL DATA:  Four-day history of RIGHT jaw pain. No known injuries. EXAM: IMPRESSION: 1. No acute osseous abnormality. 2. Unerupted third molars in both sides of the mandible and in the left maxilla.    Ct Maxillofacial W Contrast Result Date: 04/12/2018  IMPRESSION: Findings consistent with acute right tonsillitis. Associated 13 x 17 x 14 mm and 7 mm tonsillar/peritonsillar abscesses as above. Electronically Signed   By: Rise Mu M.D.   On: 04/12/2018 13:20    Todd Plan, MD 04/14/2018, 7:58 AM PGY-1, [place] [facility] FPTS Intern pager: [phone], text pages welcome

## 2018-04-14 NOTE — Progress Notes (Signed)
Patient ID: Todd Gray, male   DOB: 01/09/1981, 37 y.o.   MRN: [id number] Repeat CT scan looks similar to the previous one.  It does appear to be a peritonsillar abscess.  I discussed with patient and his significant other who is able to translate.  Recommend incision and drainage.  They both agree that this would not be possible to do under local.  We have him scheduled for tomorrow early afternoon for incision and drainage in the operating room under general anesthesia.  All questions were answered about the procedure. 

## 2018-04-14 NOTE — Progress Notes (Signed)
ENT Consult Progress Note  Subjective: Did well overnight.  Tolerating secretions, ate soup States that he has some shortness of breath when his pain medication wears off.  States he is still in a lot of pain.  Afebrile. Swelling improved but WBC rising to 19   Objective: Vitals:   04/13/18 2224 04/14/18 0534  BP: 118/73 119/81  Pulse: 73 68  Resp: 17   Temp: 98.2 F (36.8 C) (!) 97.4 F (36.3 C)  SpO2: 96% 99%    Physical Exam: CONSTITUTIONAL: well developed, nourished, no distress and alert and oriented x 3.  CARDIOVASCULAR: normal rate and regular rhythm PULMONARY/CHEST WALL: effort normal and no stridor, no stertor, no dysphonia HENT: Head : normocephalic and atraumatic Ears: Right ear:   canal normal, external ear normal and hearing normal Left ear:   canal normal, external ear normal and hearing normal Nose: nose normal and no purulence Mouth/Throat:  Mouth: uvula midline and without swelling Throat: right tonsil slightly enlarged, no palpable fluctuance. No ulcerations. Small bruise from attempted needle decompression, perhaps slight decreased in peritonsillar edema. Floor of mouth remains soft Improved trisumus. Tonsil appears less swollen Mucous membranes: normal EYES: conjunctiva normal, EOM normal and PERRL NECK: supple, trachea normal and no thyromegaly or cervical LAD Mild erythema, right neck. Tender in right level 2 on palpation, no fluctuance    Data Review/Consults/Discussions:  Reviewed Fm notes. Clinically improving.   WBC 13 (did recive IV decadron) yesterday and now 19.   Panorex yesterday- no acute dental infection noted  Assessment: Todd Gray 37 y.o. male who presents with oropharyngeal infection. CT scan done at OSH was most consistent with a fluid collection contiguous with right mandibular third molar and was not in a typical peritonsillar or intratonsillar location. CT performed here a few hours later (after receiving IV Unasyn and  decadron) demonstrated peritonsillar swelling, but again this was very very low and not in a typical PTA location. There was no discrete rim-enhancement on this scan. Maximum dimensions were 1.4cm. Since yesterday, he has improved trismus, soft palate and uvula less swollen, but continues to state that he is feeling worse. WBC rising to 19. Remains afebrile. Physical exam without significant peritonsillar fluctuance. Continues with pain with palpation.  Plan:  -IV abx per FM team- consider Unasyn if he continues to clinically worsen -Please make NPO in case of need for drainage.  -recommend CT neck with contrast   Thank you for [facility] in the care of this patient. Should you need further assistance, please call our office at [phone].    Todd StanleyAmanda Jo Marcellino, MD

## 2018-04-14 NOTE — Plan of Care (Signed)
  Problem: Clinical Measurements: Goal: Ability to maintain clinical measurements within normal limits will improve Outcome: Progressing   Problem: Activity: Goal: Risk for activity intolerance will decrease Outcome: Progressing   Problem: Safety: Goal: Ability to remain free from injury will improve Outcome: Progressing   Problem: Skin Integrity: Goal: Risk for impaired skin integrity will decrease Outcome: Progressing   

## 2018-04-14 NOTE — Plan of Care (Signed)
  Problem: Clinical Measurements: Goal: Ability to maintain clinical measurements within normal limits will improve Outcome: Progressing   Problem: Activity: Goal: Risk for activity intolerance will decrease Outcome: Progressing   Problem: Pain Managment: Goal: General experience of comfort will improve Outcome: Progressing   

## 2018-04-14 NOTE — H&P (View-Only) (Signed)
Patient ID: Margit HanksHugo Velasco-gomez, male   DOB: 08-01-1981, 37 y.o.   MRN: [id number] Repeat CT scan looks similar to the previous one.  It does appear to be a peritonsillar abscess.  I discussed with patient and his significant other who is able to translate.  Recommend incision and drainage.  They both agree that this would not be possible to do under local.  We have him scheduled for tomorrow early afternoon for incision and drainage in the operating room under general anesthesia.  All questions were answered about the procedure.

## 2018-04-15 ENCOUNTER — Encounter (HOSPITAL_COMMUNITY): Payer: Self-pay

## 2018-04-15 ENCOUNTER — Inpatient Hospital Stay (HOSPITAL_COMMUNITY): Payer: Self-pay | Admitting: Anesthesiology

## 2018-04-15 ENCOUNTER — Encounter (HOSPITAL_COMMUNITY)
Admission: EM | Disposition: A | Discharge status: Home / Self Care | Payer: Self-pay | Source: Home / Self Care | Attending: Family Medicine

## 2018-04-15 HISTORY — PX: TONSILLECTOMY: SHX5217

## 2018-04-15 LAB — CBC WITH DIFFERENTIAL/PLATELET
ABS IMMATURE GRANULOCYTES: 0.1 10*3/uL (ref 0.0–0.1)
BASOS ABS: 0.1 10*3/uL (ref 0.0–0.1)
BASOS PCT: 0 %
Eosinophils Absolute: 0.1 10*3/uL (ref 0.0–0.7)
Eosinophils Relative: 1 %
HCT: 40.5 % (ref 39.0–52.0)
Hemoglobin: 13.5 g/dL (ref 13.0–17.0)
IMMATURE GRANULOCYTES: 1 %
Lymphocytes Relative: 28 %
Lymphs Abs: 3.3 10*3/uL (ref 0.7–4.0)
MCH: 30.6 pg (ref 26.0–34.0)
MCHC: 33.3 g/dL (ref 30.0–36.0)
MCV: 91.8 fL (ref 78.0–100.0)
MONOS PCT: 11 %
Monocytes Absolute: 1.3 10*3/uL — ABNORMAL HIGH (ref 0.1–1.0)
NEUTROS ABS: 7.1 10*3/uL (ref 1.7–7.7)
NEUTROS PCT: 59 %
PLATELETS: 316 10*3/uL (ref 150–400)
RBC: 4.41 MIL/uL (ref 4.22–5.81)
RDW: 11.9 % (ref 11.5–15.5)
WBC: 11.8 10*3/uL — ABNORMAL HIGH (ref 4.0–10.5)

## 2018-04-15 LAB — SURGICAL PCR SCREEN
MRSA, PCR: NEGATIVE
Staphylococcus aureus: POSITIVE — AB

## 2018-04-15 LAB — BASIC METABOLIC PANEL
Anion gap: 6 (ref 5–15)
BUN: 9 mg/dL (ref 6–20)
CO2: 25 mmol/L (ref 22–32)
Calcium: 8.4 mg/dL — ABNORMAL LOW (ref 8.9–10.3)
Chloride: 108 mmol/L (ref 98–111)
Creatinine, Ser: 1.05 mg/dL (ref 0.61–1.24)
Glucose, Bld: 89 mg/dL (ref 70–99)
POTASSIUM: 4.2 mmol/L (ref 3.5–5.1)
SODIUM: 139 mmol/L (ref 135–145)

## 2018-04-15 SURGERY — TONSILLECTOMY
Anesthesia: General

## 2018-04-15 MED ORDER — ONDANSETRON HCL 4 MG/2ML IJ SOLN
INTRAMUSCULAR | Status: AC
  Filled 2018-04-15: qty 2

## 2018-04-15 MED ORDER — PROPOFOL 10 MG/ML IV BOLUS
INTRAVENOUS | Status: AC
  Filled 2018-04-15: qty 20

## 2018-04-15 MED ORDER — FENTANYL CITRATE (PF) 250 MCG/5ML IJ SOLN
INTRAMUSCULAR | Status: AC
  Filled 2018-04-15: qty 5

## 2018-04-15 MED ORDER — SODIUM CHLORIDE 0.9 % IV SOLN
INTRAVENOUS | Status: AC
  Administered 2018-04-15 (×2): via INTRAVENOUS

## 2018-04-15 MED ORDER — LACTATED RINGERS IV SOLN
INTRAVENOUS | Status: DC
  Administered 2018-04-15 (×2): via INTRAVENOUS

## 2018-04-15 MED ORDER — MUPIROCIN 2 % EX OINT
1.0000 "application " | TOPICAL_OINTMENT | Freq: Two times a day (BID) | CUTANEOUS | Status: DC
  Administered 2018-04-15 – 2018-04-16 (×3): 1 via NASAL
  Filled 2018-04-15: qty 22

## 2018-04-15 MED ORDER — PROPOFOL 10 MG/ML IV BOLUS
INTRAVENOUS | Status: DC | PRN
  Administered 2018-04-15: 250 mg via INTRAVENOUS

## 2018-04-15 MED ORDER — SUCCINYLCHOLINE CHLORIDE 20 MG/ML IJ SOLN
INTRAMUSCULAR | Status: DC | PRN
  Administered 2018-04-15: 140 mg via INTRAVENOUS

## 2018-04-15 MED ORDER — HYDROCODONE-ACETAMINOPHEN 7.5-325 MG/15ML PO SOLN: 10.0000 mL | ORAL | 0 refills | Status: DC | PRN

## 2018-04-15 MED ORDER — LIDOCAINE-EPINEPHRINE 1 %-1:100000 IJ SOLN
INTRAMUSCULAR | Status: DC | PRN
  Administered 2018-04-15: 1.5 mL

## 2018-04-15 MED ORDER — 0.9 % SODIUM CHLORIDE (POUR BTL) OPTIME
TOPICAL | Status: DC | PRN
  Administered 2018-04-15: 1000 mL

## 2018-04-15 MED ORDER — SUCCINYLCHOLINE CHLORIDE 200 MG/10ML IV SOSY
PREFILLED_SYRINGE | INTRAVENOUS | Status: AC
  Filled 2018-04-15: qty 10

## 2018-04-15 MED ORDER — CLINDAMYCIN HCL 300 MG PO CAPS: 300.0000 mg | ORAL_CAPSULE | Freq: Three times a day (TID) | ORAL | 0 refills | Status: DC

## 2018-04-15 MED ORDER — LIDOCAINE 2% (20 MG/ML) 5 ML SYRINGE
INTRAMUSCULAR | Status: DC | PRN
  Administered 2018-04-15: 100 mg via INTRAVENOUS

## 2018-04-15 MED ORDER — LIDOCAINE 2% (20 MG/ML) 5 ML SYRINGE
INTRAMUSCULAR | Status: AC
  Filled 2018-04-15: qty 5

## 2018-04-15 MED ORDER — CHLORHEXIDINE GLUCONATE CLOTH 2 % EX PADS
6.0000 | MEDICATED_PAD | Freq: Every day | CUTANEOUS | Status: DC
  Administered 2018-04-15 – 2018-04-16 (×2): 6 via TOPICAL

## 2018-04-15 MED ORDER — MIDAZOLAM HCL 5 MG/5ML IJ SOLN
INTRAMUSCULAR | Status: DC | PRN
  Administered 2018-04-15: 2 mg via INTRAVENOUS

## 2018-04-15 MED ORDER — PROMETHAZINE HCL 25 MG RE SUPP: 25.0000 mg | Freq: Four times a day (QID) | RECTAL | 1 refills | Status: DC | PRN

## 2018-04-15 MED ORDER — LIDOCAINE-EPINEPHRINE 1 %-1:100000 IJ SOLN
INTRAMUSCULAR | Status: AC
  Filled 2018-04-15: qty 1

## 2018-04-15 MED ORDER — SODIUM CHLORIDE 0.9 % IV SOLN
INTRAVENOUS | Status: DC
  Administered 2018-04-15: 03:00:00 via INTRAVENOUS

## 2018-04-15 MED ORDER — ONDANSETRON HCL 4 MG/2ML IJ SOLN
INTRAMUSCULAR | Status: DC | PRN
  Administered 2018-04-15: 4 mg via INTRAVENOUS

## 2018-04-15 MED ORDER — FENTANYL CITRATE (PF) 100 MCG/2ML IJ SOLN
INTRAMUSCULAR | Status: DC | PRN
  Administered 2018-04-15: 100 ug via INTRAVENOUS

## 2018-04-15 MED ORDER — MIDAZOLAM HCL 2 MG/2ML IJ SOLN
INTRAMUSCULAR | Status: AC
  Filled 2018-04-15: qty 2

## 2018-04-15 SURGICAL SUPPLY — 34 items
BLADE SURG 15 STRL LF DISP TIS (BLADE) IMPLANT
BLADE SURG 15 STRL SS (BLADE)
CANISTER SUCT 3000ML PPV (MISCELLANEOUS) ×3 IMPLANT
CATH ROBINSON RED A/P 10FR (CATHETERS) ×3 IMPLANT
CLEANER TIP ELECTROSURG 2X2 (MISCELLANEOUS) ×3 IMPLANT
COAGULATOR SUCT 6 FR SWTCH (ELECTROSURGICAL) ×1
COAGULATOR SUCT SWTCH 10FR 6 (ELECTROSURGICAL) ×2 IMPLANT
DRAPE HALF SHEET 40X57 (DRAPES) IMPLANT
ELECT COATED BLADE 2.86 ST (ELECTRODE) ×3 IMPLANT
ELECT REM PT RETURN 9FT ADLT (ELECTROSURGICAL)
ELECT REM PT RETURN 9FT PED (ELECTROSURGICAL)
ELECTRODE REM PT RETRN 9FT PED (ELECTROSURGICAL) IMPLANT
ELECTRODE REM PT RTRN 9FT ADLT (ELECTROSURGICAL) IMPLANT
GAUZE 4X4 16PLY RFD (DISPOSABLE) ×3 IMPLANT
GLOVE ECLIPSE 7.5 STRL STRAW (GLOVE) ×3 IMPLANT
GOWN STRL REUS W/ TWL LRG LVL3 (GOWN DISPOSABLE) ×2 IMPLANT
GOWN STRL REUS W/TWL LRG LVL3 (GOWN DISPOSABLE) ×6
KIT BASIN OR (CUSTOM PROCEDURE TRAY) ×3 IMPLANT
KIT TURNOVER KIT B (KITS) ×3 IMPLANT
NS IRRIG 1000ML POUR BTL (IV SOLUTION) ×3 IMPLANT
PACK SURGICAL SETUP 50X90 (CUSTOM PROCEDURE TRAY) ×3 IMPLANT
PAD ARMBOARD 7.5X6 YLW CONV (MISCELLANEOUS) ×6 IMPLANT
PENCIL FOOT CONTROL (ELECTRODE) ×3 IMPLANT
SPECIMEN JAR SMALL (MISCELLANEOUS) ×6 IMPLANT
SPONGE TONSIL TAPE 1 RFD (DISPOSABLE) ×3 IMPLANT
SYR BULB 3OZ (MISCELLANEOUS) ×3 IMPLANT
TOWEL OR 17X24 6PK STRL BLUE (TOWEL DISPOSABLE) ×6 IMPLANT
TUBE CONNECTING 12'X1/4 (SUCTIONS) ×1
TUBE CONNECTING 12X1/4 (SUCTIONS) ×2 IMPLANT
TUBE SALEM SUMP 10F W/ARV (TUBING) IMPLANT
TUBE SALEM SUMP 12R W/ARV (TUBING) IMPLANT
TUBE SALEM SUMP 14F W/ARV (TUBING) IMPLANT
TUBE SALEM SUMP 16 FR W/ARV (TUBING) IMPLANT
WATER STERILE IRR 1000ML POUR (IV SOLUTION) ×3 IMPLANT

## 2018-04-15 NOTE — Anesthesia Procedure Notes (Signed)
Procedure Name: Intubation Date/Time: 04/15/2018 12:35 PM Performed by: Fransisca KaufmannMeyer, Kramer Hanrahan E, CRNA Pre-anesthesia Checklist: Patient identified, Emergency Drugs available, Suction available and Patient being monitored Patient Re-evaluated:Patient Re-evaluated prior to induction Oxygen Delivery Method: Circle System Utilized Preoxygenation: Pre-oxygenation with 100% oxygen Induction Type: IV induction Ventilation: Mask ventilation without difficulty Laryngoscope Size: Glidescope and 4 Grade View: Grade II Tube type: Oral Tube size: 7.5 mm Number of attempts: 1 Airway Equipment and Method: Stylet,  Oral airway,  Rigid stylet and Video-laryngoscopy Placement Confirmation: ETT inserted through vocal cords under direct vision,  positive ETCO2 and breath sounds checked- equal and bilateral Secured at: 24 cm Tube secured with: Tape Dental Injury: Teeth and Oropharynx as per pre-operative assessment  Difficulty Due To: Difficulty was anticipated, Difficult Airway-  due to edematous airway, Difficult Airway- due to limited oral opening and Difficult Airway- due to anterior larynx

## 2018-04-15 NOTE — Op Note (Signed)
04/15/2018  12:59 PM  PATIENT:  Todd HanksHugo Gray  37 y.o. male  PRE-OPERATIVE DIAGNOSIS:  Peritonsillar abscess  POST-OPERATIVE DIAGNOSIS:  Peritonsillar abscess  PROCEDURE:  Procedure(s): Incision and drainage of right peritonsillar abscess  SURGEON:  Surgeon(s): Serena Colonelosen, Pavel Gadd, MD  ANESTHESIA:   General  COUNTS: Correct   DICTATION: The patient was taken to the operating room and placed on the operating table in the supine position. Following induction of general endotracheal anesthesia, the table was turned and the patient was draped in a standard fashion. A Crowe-Davis mouthgag was inserted into the oral cavity and used to retract the tongue and mandible, then attached to the Mayo stand.  The pharynx was inspected and 1% Xylocaine with epinephrine was infiltrated into the left side soft palate and anterior fossa large.  A number 18-gauge needle was used to aspirate and about 3 cc of pus was obtained.  Electrocautery was used to incise the mucosa of the soft palate and to expose an open up the abscess cavity.  A tonsil hemostat was used to widely open the abscess cavity.  No further purulence was identified..  The pharynx was irrigated with saline and suctioned. The patient was then awakened from anesthesia and transferred to PACU in stable condition.   PATIENT DISPOSITION:  To PACA, stable

## 2018-04-15 NOTE — Progress Notes (Signed)
Family Medicine Teaching Service Daily Progress Note Intern Pager: [phone]  Patient name: Todd HanksHugo Gray Medical record number: [id number] Date of birth: November 15, 1980 Age: 37 y.o. Gender: male  Primary Care Provider: Patient, No Pcp Per Consultants: ENT Code Status: Full code  Pt Overview and Major Events to Date:  Hospital Day 2 Admitted: 04/12/2018 9/13 I & D with ENT  Assessment and Plan: Todd GuiseHugo Gray a 37 y.o.malewho presents with fever throat pain x 3 days with CT consistent with acute tonsillitis with abscess formation.No significant past medical history.  #Acute tonsillitis with abscess formation  Rx ? Continue clindamycin 600 mg IV.  Conversion to p.o. clindamycin whenever patient tolerates p.o.  ? Morphine PRN Severe pain. IV Toradol for moderate-severe pain. Tylenol for mild pain.  ENT recs ? I and D in OR today ? We will monitor postop pain.  Appreciate postop recommendations from ENT.  #Neck pain  Patient is complaining this morning of posterior neck and shoulder pain.  The right side is worse than the left.  It is tender to palpation on physical exam.  Most likely MSK as patient has been sitting in bed for a few days.  Also in the setting of his neck pain, very likely that patient is tensing other muscles in the region.  Have encouraged patient to move around with more activity and stretch throughout the day.  We will continue to monitor and follow.  #Leukocytosis WBC 19.6 > 11.8   Likely due to decadron x 5 as patient remains afebrile and vitals wnl.  Acute drop in Hgb, stable 15.3 > 13.5 > 13.5, stable today Follow on CBC  F:100 ml/hr NS E:n/a N:NPO for sg today, return to diet recs per ENT.  GI ppx: PEG DVT Prophylaxis:Lovenox   Disposition:home, pending improvement/stablization of abscess  Medications: Scheduled Meds: . Chlorhexidine Gluconate Cloth  6 each Topical Daily  . enoxaparin (LOVENOX) injection  40 mg Subcutaneous Q24H  .  mupirocin ointment  1 application Nasal BID   Continuous Infusions: . sodium chloride 100 mL/hr at 04/15/18 0309  . clindamycin (CLEOCIN) IV 600 mg (04/15/18 [id number])   PRN Meds: acetaminophen **OR** acetaminophen, alum & mag hydroxide-simeth, ketorolac, morphine injection, ondansetron **OR** ondansetron (ZOFRAN) IV, polyethylene glycol  ================================================= ================================================= Subjective:  Patient is still in pain this morning.  He complains of some pain in his shoulders right worse than left   Objective: Temp:  [97.9 F (36.6 C)-98.4 F (36.9 C)] 97.9 F (36.6 C) (09/13 0607) Pulse Rate:  [70-87] 70 (09/13 0607) Resp:  [18] 18 (09/13 0607) BP: (104-114)/(63-78) 104/71 (09/13 0607) SpO2:  [98 %] 98 % (09/13 0607) Weight:  [93.4 kg] 93.4 kg (09/12 1500)  Physical Exam:  Gen: NAD, alert, non-toxic, well-nourished, well-appearing, lying in bed sleeping HEENT: Normocephaic, atraumatic. Clear conjuctiva, no scleral icterus and injection.  Mouth: Right-sided tonsil appears swollen.  Patient is able to open mouth wider than yesterday.  Patient continues to wince with tongue depressor at posterior tongue. Neck: Right-sided neck continues to be tender to palpation.  Mass appreciated at mandibular bone midline.  It is immobile and nonfluctuant.  Patient is also tender to palpation on right neck and shoulders. CV: Regular rate and rhythm.  Normal S1-S2.   Normal capillary refill bilaterally.  Radial pulses 2+ bilaterally. No bilateral lower extremity edema. Resp: Clear to auscultation bilaterally.  No wheezing, rales, abnormal lung sounds.  No increased work of breathing appreciated. Abd: Nontender and nondistended on palpation to all 4 quadrants.  Positive  bowel sounds. Psych: Cooperative with exam. Pleasant. Makes eye contact. Extremities: Full ROM    Laboratory: Recent Labs  Lab 04/13/18 0530 04/14/18 0415 04/15/18 0437  WBC  13.9* 19.6* 11.8*  HGB 15.3 13.5 13.5  HCT 43.0 39.6 40.5  PLT 346 333 316   Recent Labs  Lab 04/12/18 1121 04/13/18 0530 04/15/18 0437  NA 138 139 139  K 4.0 4.0 4.2  CL 106 109 108  CO2 21* 20* 25  BUN 18 17 9   CREATININE 1.14 0.95 1.05  CALCIUM 9.4 8.9 8.4*  PROT  --  6.8  --   BILITOT  --  0.9  --   ALKPHOS  --  90  --   ALT  --  17  --   AST  --  14*  --   GLUCOSE 116* 136* 89   Imaging/Diagnostic Tests: Dg Orthopantogram  Result Date: 04/13/2018 CLINICAL DATA:  Four-day history of RIGHT jaw pain. No known injuries. EXAM: ORTHOPANTOGRAM/PANORAMIC COMPARISON:  CT maxillofacial yesterday 04/12/2018. FINDINGS: No evidence of acute fracture. Temporomandibular joints anatomically aligned. Unerupted third molars in both sides of the mandible and in the left maxilla. No evidence of periapical lucencies to suggest dental abscess. IMPRESSION: 1. No acute osseous abnormality. 2. Unerupted third molars in both sides of the mandible and in the left maxilla. Electronically Signed   By: Hulan Saas M.D.   On: 04/13/2018 14:16   Ct Maxillofacial W Contrast  Result Date: 04/14/2018 CLINICAL DATA:  Fluid collection contiguous with the right mandibular molar EXAM: CT MAXILLOFACIAL WITH CONTRAST TECHNIQUE: Multidetector CT imaging of the maxillofacial structures was performed with intravenous contrast. Multiplanar CT image reconstructions were also generated. CONTRAST:  75mL OMNIPAQUE IOHEXOL 300 MG/ML  SOLN COMPARISON:  04/12/2018 FINDINGS: Osseous: Normal. Patient does not appear to have dental decay or periodontal disease. Unerupted molar teeth remain. Orbits: Normal Sinuses: Normal Soft tissues: Tonsillitis on the right with tonsillar and peritonsillar abscesses, similar to the previous exam. Peritonsillar component measures up to 16 mm in diameter. Tonsillar component measures up to 11 mm in diameter. No evidence of deep space extension. Limited intracranial: Normal IMPRESSION:  Redemonstration of right-sided tonsillitis with tonsillar and peritonsillar abscess formation as above. No sign that this derives from the dentition. The appearance is fairly similar, without deep space extension. Electronically Signed   By: Paulina Fusi M.D.   On: 04/14/2018 16:15      Melene Plan, MD 04/15/2018, 7:51 AM PGY-1, [facility] FPTS Intern pager: [phone], text pages welcome

## 2018-04-15 NOTE — Anesthesia Postprocedure Evaluation (Signed)
Anesthesia Post Note  Patient: Todd HanksHugo Gray  Procedure(s) Performed: IRRIGATION AND DEBRIDEMENT OF TONSIL (N/A )     Patient location during evaluation: PACU Anesthesia Type: General Level of consciousness: awake Pain management: pain level controlled Vital Signs Assessment: post-procedure vital signs reviewed and stable Respiratory status: spontaneous breathing Cardiovascular status: stable Postop Assessment: no apparent nausea or vomiting Anesthetic complications: no    Last Vitals:  Vitals:   04/15/18 1335 04/15/18 1345  BP: 127/68 117/81  Pulse:  86  Resp:  15  Temp:  37 C  SpO2:  96%    Last Pain:  Vitals:   04/15/18 1345  TempSrc: Oral  PainSc:    Pain Goal: Patients Stated Pain Goal: 0 (04/15/18 0954)               Zaccheus Edmister JR,JOHN Susann GivensFRANKLIN

## 2018-04-15 NOTE — Transfer of Care (Signed)
Immediate Anesthesia Transfer of Care Note  Patient: Margit HanksHugo Velasco-gomez  Procedure(s) Performed: IRRIGATION AND DEBRIDEMENT OF TONSIL (N/A )  Patient Location: PACU  Anesthesia Type:General  Level of Consciousness: awake, alert , oriented and sedated  Airway & Oxygen Therapy: Patient Spontanous Breathing and Patient connected to face mask oxygen  Post-op Assessment: Report given to RN, Post -op Vital signs reviewed and stable and Patient moving all extremities  Post vital signs: Reviewed and stable  Last Vitals:  Vitals Value Taken Time  BP 119/83 04/15/2018  1:12 PM  Temp    Pulse 94 04/15/2018  1:15 PM  Resp 21 04/15/2018  1:15 PM  SpO2 96 % 04/15/2018  1:15 PM  Vitals shown include unvalidated device data.  Last Pain:  Vitals:   04/15/18 1030  TempSrc:   PainSc: 5       Patients Stated Pain Goal: 0 (04/15/18 0954)  Complications: No apparent anesthesia complications

## 2018-04-15 NOTE — Anesthesia Preprocedure Evaluation (Signed)
Anesthesia Evaluation  Patient identified by MRN, date of birth, ID band Patient awake    Reviewed: Allergy & Precautions, H&P , NPO status , Patient's Chart, lab work & pertinent test results  Airway Mallampati: III  TM Distance: >3 FB Neck ROM: full    Dental no notable dental hx. (+) Teeth Intact   Pulmonary Current Smoker,    Pulmonary exam normal breath sounds clear to auscultation       Cardiovascular negative cardio ROS Normal cardiovascular exam Rhythm:regular Rate:Normal     Neuro/Psych negative neurological ROS  negative psych ROS   GI/Hepatic negative GI ROS, Neg liver ROS,   Endo/Other  negative endocrine ROS  Renal/GU negative Renal ROS  negative genitourinary   Musculoskeletal negative musculoskeletal ROS (+)   Abdominal (+) + obese,   Peds  Hematology negative hematology ROS (+)   Anesthesia Other Findings   Reproductive/Obstetrics negative OB ROS                             Anesthesia Physical Anesthesia Plan  ASA: II  Anesthesia Plan: General   Post-op Pain Management:    Induction: Intravenous  PONV Risk Score and Plan: 3 and Ondansetron and Dexamethasone  Airway Management Planned: Oral ETT and Video Laryngoscope Planned  Additional Equipment:   Intra-op Plan:   Post-operative Plan: Extubation in OR  Informed Consent: I have reviewed the patients History and Physical, chart, labs and discussed the procedure including the risks, benefits and alternatives for the proposed anesthesia with the patient or authorized representative who has indicated his/her understanding and acceptance.   Dental advisory given  Plan Discussed with: CRNA and Surgeon  Anesthesia Plan Comments:         Anesthesia Quick Evaluation

## 2018-04-15 NOTE — Interval H&P Note (Signed)
History and Physical Interval Note:  04/15/2018 12:11 PM  [name]  has presented today for surgery, with the diagnosis of Peritonsillar abscess  The various methods of treatment have been discussed with the patient and family. After consideration of risks, benefits and other options for treatment, the patient has consented to  Procedure(s): IRRIGATION AND DEBRIDEMENT OF TONSIL (N/A) as a surgical intervention .  The patient's history has been reviewed, patient examined, no change in status, stable for surgery.  I have reviewed the patient's chart and labs.  Questions were answered to the patient's satisfaction.     Serena ColonelJefry Adylynn Hertenstein

## 2018-04-16 ENCOUNTER — Encounter (HOSPITAL_COMMUNITY): Payer: Self-pay | Admitting: Otolaryngology

## 2018-04-16 LAB — TROPONIN I: Troponin I: <0.03 ng/mL (ref <0.03)

## 2018-04-16 MED ORDER — POLYETHYLENE GLYCOL 3350 17 G PO PACK: 17.0000 g | PACK | Freq: Every day | ORAL | 0 refills | Status: AC | PRN

## 2018-04-16 MED ORDER — IBUPROFEN 400 MG PO TABS
400.0000 mg | ORAL_TABLET | ORAL | Status: DC | PRN
  Administered 2018-04-16: 400 mg via ORAL
  Filled 2018-04-16: qty 1

## 2018-04-16 MED ORDER — CLINDAMYCIN HCL 300 MG PO CAPS: 300.0000 mg | ORAL_CAPSULE | Freq: Three times a day (TID) | ORAL | 0 refills | Status: AC

## 2018-04-16 MED ORDER — CLINDAMYCIN HCL 300 MG PO CAPS
300.0000 mg | ORAL_CAPSULE | Freq: Three times a day (TID) | ORAL | Status: DC
  Administered 2018-04-16: 300 mg via ORAL
  Filled 2018-04-16: qty 1

## 2018-04-16 MED ORDER — OXYCODONE HCL 5 MG PO TABS: 5.0000 mg | ORAL_TABLET | Freq: Three times a day (TID) | ORAL | 0 refills | Status: AC | PRN

## 2018-04-16 MED ORDER — MUPIROCIN 2 % EX OINT: 1.0000 "application " | TOPICAL_OINTMENT | Freq: Two times a day (BID) | CUTANEOUS | 0 refills | Status: AC

## 2018-04-16 MED ORDER — ACETAMINOPHEN 325 MG PO TABS: 650.0000 mg | ORAL_TABLET | Freq: Four times a day (QID) | ORAL | 0 refills | Status: AC | PRN

## 2018-04-16 MED ORDER — IBUPROFEN 400 MG PO TABS: 400.0000 mg | ORAL_TABLET | ORAL | 0 refills | Status: AC | PRN

## 2018-04-16 NOTE — Progress Notes (Addendum)
Family Medicine Teaching Service Daily Progress Note Intern Pager: [phone]  Patient name: Margit HanksHugo Velasco-gomez Medical record number: [id number] Date of birth: Jul 31, 1981 Age: 37 y.o. Gender: male  Primary Care Provider: Patient, No Pcp Per Consultants: ENT Code Status: Full code  Pt Overview and Major Events to Date:  Hospital Day 3 Admitted: 04/12/2018 04/15/18 ID with ENT  Assessment and Plan: Lavera GuiseHugo Gomezis a 37 y.o.malewho presents with fever throat pain x 3 days with CT consistent with acute tonsillitis with abscess formation.No significant past medical history.  #Acute tonsillitis with abscess formations/p I&D  Rx ? Transition to PO clindamycin 500mg  TID per Sg. Total tx 10 days  ? D/C IV meds for pain. Tylenol and ibuprofen. Can start oxycodone 2.5 q6 hours for severe pain.   ENT recs ? Okay to go home  ? Should be back to normal in 72 hours, if not, patient will need to reach out to surgery.   Likely DC today if tolerates PO medications   #Chest Pain Troponins neg x 1, EKG: negative, NSR. No pain with exertion, only with positional movement.  Likely due to chest strap from surgery. Low suspicion for ACS as patient reports pain only with positional movement of muscles in the area.   #MSK Neck pain   Likely MSK as patient has been in bed for last several days. Will encourage patient to ambulate and get out of bed as much as possible.   #Leukocytosis, resolved  Acute drop in Hgb, stable  Follow up CBC outpatient as patient is stable   N:PO GI ppx:PEG DVT Prophylaxis:Lovenox  Disposition: home  ================================================= ================================================= Subjective:  Patient tolerated surgery well yesterday. His pain is much better in his throat today. He complains of neck pain, the same from eysterday. Patient reports walking around yesterday. He also complains of chest pain, band like region across his chest.     Objective: Temp:  [98 F (36.7 C)-99.2 F (37.3 C)] 98.2 F (36.8 C) (09/14 0539) Pulse Rate:  [77-94] 77 (09/14 0539) Resp:  [11-18] 18 (09/14 0539) BP: (109-131)/(57-82) 109/71 (09/14 0539) SpO2:  [93 %-99 %] 97 % (09/14 0539) Intake/Output 09/13 0701 - 09/14 0700 In: 450 [I.V.:400; IV Piggyback:50] Out: 12 [Urine:2; Blood:10]   Physical Exam:  Gen: NAD, alert, non-toxic, well-nourished, well-appearing, sitting comfortably  HEENT: Normocephaic, atraumatic. Clear conjuctiva, no scleral icterus and injection.  Neck: supple. Immobile nodule appreciated from yesterday, remains hard to touch. Nto as TTP. CV: Regular rate and rhythm.  Normal S1-S2.   Normal capillary refill bilaterally.  Radial pulses 2+ bilaterally. No bilateral lower extremity edema. Resp: Clear to auscultation bilaterally.  No wheezing, rales, abnormal lung sounds.  No increased work of breathing appreciated. Abd: Nontender and nondistended on palpation to all 4 quadrants.  Positive bowel sounds. Psych: Cooperative with exam. Pleasant. Makes eye contact. Extremities: Full ROM   Laboratory: Trops x1 negative   Imaging/Diagnostic Tests: EKG: nsr   Melene PlanKim, Kierstan Auer E, MD 04/16/2018, 6:51 AM PGY-1, [facility] FPTS Intern pager: [phone], text pages welcome

## 2018-04-16 NOTE — Discharge Instructions (Signed)
Dear Todd HanksHugo Gray,   Thank you for letting us participate in your care! In this section, you will find a brief hospital admission summary of why you were admitted to the hospital, what happened, and any further follow up we think would benefit you and your health:   You were admitted because you were experiencing throat pain, fever, swelling. Your initial work up was significant for findings of peritonsillar abscess.   You were treated with IV antibiotics and IV fluids as you were unable to eat without pain. You were also seen by ENT. They recommended surgery called incision and drainage to clear the infection from your throat. Your pain improved and you were discharged from the hospital for meeting this goal.   You also complained of chest pain.  Your follow-up testing for heart attack were normal and we do not think that this is what you experienced.  It is most likely pain from your surgical procedure, as they had to strap your body to the surgical bed so you would not fall.  If your chest pain continues, is worsened with walking, or you experience shortness of breath, please seek care at the closest emergency department or urgent care office.  POST-HOSPITAL/FOLLOW-UP CARE INSTRUCTIONS Home instructions:  1. Continue to take your antibiotic medication for 10 days.  He will take 300 mg, 1 tablet, 3 times a day.  Please make sure to take these antibiotics for the full 10 days and do not stop the course of early. 2. Your pain can be managed with over-the-counter ibuprofen and Tylenol.  Please do not take over 4 g of Tylenol in 1 day.  Please do not take over 2400 g of ibuprofen in 1 day.  We have also provided 3 days of oxycodone for breakthrough pain to take only for severe pain as needed. 3. Please follow-up with your primary care provider within the next week for a quick hospital follow-up  [facility] Take care and be well!  Family Medicine Teaching Service  Inpatient Team [place]  [facility]  [address] [place], [place] [address] [phone]

## 2018-04-16 NOTE — Discharge Summary (Signed)
Family Medicine Teaching [facility] Discharge Summary  Patient name: Todd Gray Medical record number: [id number] Date of birth: [birth year] Age: 37 y.o. Gender: male Date of Admission: 04/12/2018  Date of Discharge: 04/16/18  Admitting Physician: Melene Plan, MD  Primary Care Provider: Patient, No Pcp Per Consultants: ENT   Indication for Hospitalization: Peritonsillar abscess  Discharge Diagnoses/Problem List:  Patient Active Problem List   Diagnosis Date Noted  . Odontogenic infection of jaw   . Peritonsillar abscess 04/12/2018   Disposition: Home  Discharge Condition: Good  Discharge Exam:  BP 119/80 (BP Location: Left Arm)   Pulse 79   Temp 98.2 F (36.8 C) (Oral)   Resp 17   Ht 5\' 7"  (1.702 m)   Wt 93.4 kg   SpO2 98%   BMI 32.26 kg/m   Gen: NAD, alert, non-toxic, well-nourished, well-appearing, sitting comfortably  HEENT: Normocephaic, atraumatic. Clear conjuctiva, no scleral icterus and injection.  Mouth: Mouth opens with less pain, mild edema.  Neck: supple. Immobile nodule appreciated from yesterday, remains hard to touch. Nto as TTP. CV: Regular rate and rhythm.  Normal S1-S2.   Normal capillary refill bilaterally.  Radial pulses 2+ bilaterally. No bilateral lower extremity edema. Resp: Clear to auscultation bilaterally.  No wheezing, rales, abnormal lung sounds.  No increased work of breathing appreciated. Abd: Nontender and nondistended on palpation to all 4 quadrants.  Positive bowel sounds. Psych: Cooperative with exam. Pleasant. Makes eye contact. Extremities: Full ROM   Brief Hospital Course:  Todd Gray is a 37 y.o. male with no significant past medical history, who presented with sore and swelling throat and found to have peritonsillar abscess.  Initial work up with significant for peritonsillar abscess via CT maxillofacial. Patient was intially admitted to telemetry and started on IV clindamycin with IV and p.o. pain control.  Consulting providers included ENT. Recommendations were as follows: Incision and drainage in the OR under anesthesia. Other hospital stay findings included chest pain on postop day 1.  His chest pain was worsened with movement of his sternal area, with tenderness to touch.  It was described as across his entire chest, consistent with surgical strap, has patient had to be Trendelenburg the procedure.  His EKG was negative and his troponins were negative x2.  Patient's pain was improved in his throat by the day of discharge.  He was discharged with a 10-day p.o. course of 300 mg clindamycin 3 times daily.  He was also given 3 days of 5 mg oxycodone every 8 hours as needed severe pain.  Patient was also counseled on tobacco cessation. Patient is to follow-up with ENT in 2 weeks.  If he does not feel better and back to normal in 72 hours, ENT request that he see them sooner.  Issues for Follow Up:  1. Symptom improvement of surgical site and throat  2. Continuation of antibiotics for full course 3. Tobacco cessation  Significant Procedures:   Procedure Orders     EKG 12-Lead     EKG 12-Lead     EKG 12-Lead  Significant Labs and Imaging:  Recent Labs  Lab 04/13/18 0530 04/14/18 0415 04/15/18 0437  WBC 13.9* 19.6* 11.8*  HGB 15.3 13.5 13.5  HCT 43.0 39.6 40.5  PLT 346 333 316   Recent Labs  Lab 04/12/18 1121 04/13/18 0530 04/15/18 0437  NA 138 139 139  K 4.0 4.0 4.2  CL 106 109 108  CO2 21* 20* 25  GLUCOSE 116* 136* 89  BUN 18  17 9  CREATININE 1.14 0.95 1.05  CALCIUM 9.4 8.9 8.4*  ALKPHOS  --  90  --   AST  --  14*  --   ALT  --  17  --   ALBUMIN  --  3.5  --     Dg Orthopantogram  Result Date: 04/13/2018 CLINICAL DATA:  Four-day history of RIGHT jaw pain. No known injuries. EXAM: ORTHOPANTOGRAM/PANORAMIC COMPARISON:  CT maxillofacial yesterday 04/12/2018. FINDINGS: No evidence of acute fracture. Temporomandibular joints anatomically aligned. Unerupted third molars in both  sides of the mandible and in the left maxilla. No evidence of periapical lucencies to suggest dental abscess. IMPRESSION: 1. No acute osseous abnormality. 2. Unerupted third molars in both sides of the mandible and in the left maxilla. Electronically Signed   By: Hulan Saas M.D.   On: 04/13/2018 14:16   Ct Maxillofacial W Contrast  Result Date: 04/14/2018 CLINICAL DATA:  Fluid collection contiguous with the right mandibular molar EXAM: CT MAXILLOFACIAL WITH CONTRAST TECHNIQUE: Multidetector CT imaging of the maxillofacial structures was performed with intravenous contrast. Multiplanar CT image reconstructions were also generated. CONTRAST:  75mL OMNIPAQUE IOHEXOL 300 MG/ML  SOLN COMPARISON:  04/12/2018 FINDINGS: Osseous: Normal. Patient does not appear to have dental decay or periodontal disease. Unerupted molar teeth remain. Orbits: Normal Sinuses: Normal Soft tissues: Tonsillitis on the right with tonsillar and peritonsillar abscesses, similar to the previous exam. Peritonsillar component measures up to 16 mm in diameter. Tonsillar component measures up to 11 mm in diameter. No evidence of deep space extension. Limited intracranial: Normal IMPRESSION: Redemonstration of right-sided tonsillitis with tonsillar and peritonsillar abscess formation as above. No sign that this derives from the dentition. The appearance is fairly similar, without deep space extension. Electronically Signed   By: Paulina Fusi M.D.   On: 04/14/2018 16:15   Ct Maxillofacial W Contrast  Result Date: 04/12/2018 CLINICAL DATA:  Initial evaluation for maxillofacial swelling, abscess. EXAM: CT MAXILLOFACIAL WITH CONTRAST TECHNIQUE: Multidetector CT imaging of the maxillofacial structures was performed with intravenous contrast. Multiplanar CT image reconstructions were also generated. CONTRAST:  75mL OMNIPAQUE IOHEXOL 300 MG/ML  SOLN COMPARISON:  None. FINDINGS: Osseous: No acute osseous abnormality within the face. Visualized  dentition within normal limits. Orbits: Globes and orbital soft tissues within normal limits. Sinuses: Left maxillary sinus retention cyst. Paranasal sinuses otherwise clear. Mastoid air cells and middle ear cavities are well pneumatized and free of fluid. Soft tissues: Oral cavity within normal limits. Asymmetric enlargement of the right palatine tonsil, consistent with acute tonsillitis. Irregular hypodense collection measuring 13 x 17 x 14 mm consistent with tonsillar/peritonsillar abscess (series 4, image 33). Additional smaller component posteriorly measures 7 mm (series 4, image 36), likely contiguous. Associated swelling with inflammatory stranding within the adjacent right parapharyngeal fat. No retropharyngeal collection. Left oropharynx within normal limits. Mild asymmetric prominence of right level II lymph nodes, likely reactive. Limited intracranial: Subcentimeter parenchymal calcification at the anterior left frontal lobe. Probable dilated perivascular space at the inferior right basal ganglia. Visualized portions of the brain otherwise unremarkable. IMPRESSION: Findings consistent with acute right tonsillitis. Associated 13 x 17 x 14 mm and 7 mm tonsillar/peritonsillar abscesses as above. Electronically Signed   By: Rise Mu M.D.   On: 04/12/2018 13:20    Results/Tests Pending at Time of Discharge:  Intra- op cultures   Discharge Medications:  Allergies as of 04/16/2018   No Known Allergies     Medication List    TAKE these medications  acetaminophen 325 MG tablet Commonly known as:  TYLENOL Take 2 tablets (650 mg total) by mouth every 6 (six) hours as needed for up to 7 days for mild pain (or Fever >/= 101).   clindamycin 300 MG capsule Commonly known as:  CLEOCIN Take 1 capsule (300 mg total) by mouth every 8 (eight) hours for 10 days.   ibuprofen 400 MG tablet Commonly known as:  ADVIL,MOTRIN Take 1 tablet (400 mg total) by mouth every 4 (four) hours as needed  for mild pain.   mupirocin ointment 2 % Commonly known as:  BACTROBAN Place 1 application into the nose 2 (two) times daily.   oxyCODONE 5 MG immediate release tablet Commonly known as:  Oxy IR/ROXICODONE Take 1 tablet (5 mg total) by mouth every 8 (eight) hours as needed for up to 3 days.   polyethylene glycol packet Commonly known as:  MIRALAX / GLYCOLAX Take 17 g by mouth daily as needed for mild constipation.       Discharge Instructions: Please refer to Patient Instructions section of EMR for full details.  Patient was counseled important signs and symptoms that should prompt return to medical care, changes in medications, dietary instructions, activity restrictions, and follow up appointments.   Follow-Up Appointments: No future appointments.   Melene PlanKim, Sherina Stammer E, MD 04/16/2018, 12:37 PM PGY-1, [facility]

## 2018-04-16 NOTE — Plan of Care (Signed)
  Problem: Nutrition: Goal: Adequate nutrition will be maintained Outcome: Progressing   Problem: Pain Managment: Goal: General experience of comfort will improve Outcome: Progressing   

## 2018-04-16 NOTE — Progress Notes (Signed)
1 Day Post-Op   Subjective/Chief Complaint: Patient is having chest pain that he feels like somebody sitting on his chest. Slight shortness of breath. His throat is much better. He is able to swallow reasonably well now.   Objective: Vital signs in last 24 hours: Temp:  [98 F (36.7 C)-99.2 F (37.3 C)] 98.9 F (37.2 C) (09/14 0817) Pulse Rate:  [75-94] 75 (09/14 0817) Resp:  [11-18] 18 (09/14 0817) BP: (109-131)/(57-82) 121/81 (09/14 0817) SpO2:  [93 %-99 %] 97 % (09/14 0817) Last BM Date: 04/14/18  Intake/Output from previous day: 09/13 0701 - 09/14 0700 In: 450 [I.V.:400; IV Piggyback:50] Out: 12 [Urine:2; Blood:10] Intake/Output this shift: No intake/output data recorded.  he opens his mouth well. There looks like there's minimal amount of edema. Tongue is normal. Neck is without swelling.  Lab Results:  Recent Labs    04/14/18 0415 04/15/18 0437  WBC 19.6* 11.8*  HGB 13.5 13.5  HCT 39.6 40.5  PLT 333 316   BMET Recent Labs    04/15/18 0437  NA 139  K 4.2  CL 108  CO2 25  GLUCOSE 89  BUN 9  CREATININE 1.05  CALCIUM 8.4*   PT/INR No results for input(s): LABPROT, INR in the last 72 hours. ABG No results for input(s): PHART, HCO3 in the last 72 hours.  Invalid input(s): PCO2, PO2  Studies/Results: Ct Maxillofacial W Contrast  Result Date: 04/14/2018 CLINICAL DATA:  Fluid collection contiguous with the right mandibular molar EXAM: CT MAXILLOFACIAL WITH CONTRAST TECHNIQUE: Multidetector CT imaging of the maxillofacial structures was performed with intravenous contrast. Multiplanar CT image reconstructions were also generated. CONTRAST:  75mL OMNIPAQUE IOHEXOL 300 MG/ML  SOLN COMPARISON:  04/12/2018 FINDINGS: Osseous: Normal. Patient does not appear to have dental decay or periodontal disease. Unerupted molar teeth remain. Orbits: Normal Sinuses: Normal Soft tissues: Tonsillitis on the right with tonsillar and peritonsillar abscesses, similar to the previous  exam. Peritonsillar component measures up to 16 mm in diameter. Tonsillar component measures up to 11 mm in diameter. No evidence of deep space extension. Limited intracranial: Normal IMPRESSION: Redemonstration of right-sided tonsillitis with tonsillar and peritonsillar abscess formation as above. No sign that this derives from the dentition. The appearance is fairly similar, without deep space extension. Electronically Signed   By: Paulina Fusi M.D.   On: 04/14/2018 16:15    Anti-infectives: Anti-infectives (From admission, onward)   Start     Dose/Rate Route Frequency Ordered Stop   04/15/18 0000  clindamycin (CLEOCIN) 300 MG capsule     300 mg Oral 3 times daily 04/15/18 1312 04/25/18 2359   04/12/18 2100  clindamycin (CLEOCIN) IVPB 600 mg     600 mg 100 mL/hr over 30 Minutes Intravenous Every 8 hours 04/12/18 2023     04/12/18 1045  clindamycin (CLEOCIN) IVPB 900 mg     900 mg 100 mL/hr over 30 Minutes Intravenous  Once 04/12/18 1040 04/12/18 1212      Assessment/Plan: s/p Procedure(s): IRRIGATION AND DEBRIDEMENT OF TONSIL (N/A) he is having chest pain and the internal medicine doctor will be called. EKG was ordered. I will leave it to the medical doctors to further workup his chest pain. From a standpoint of his peritonsillar abscess he can go home as he is better and is now drained. Discharge on clindamycin 300 mg 3 times a day for 10 days. follow-up in 2 weeks as long as he continues to improve with no worsening symptoms of his throat. He should be almost  back to normal within 72 hours and if not he should call.  LOS: 3 days    Suzanna ObeyJohn Bailie Christenbury 04/16/2018

## 2018-04-16 NOTE — Progress Notes (Signed)
Pt complained to Dr. Jearld FentonByers (on rounds) of midsternal chest pain, no shortness of breath.  Pt states he had been coughing up a little blood also.  Vital signs stable.  EKG to be completed.  Pt is not diaphoretic.  Family at bedside.  Family Med resident informed.

## 2018-04-16 NOTE — Progress Notes (Signed)
EKG completed, labs drawn, Vital signs done. Morphine and zofran given, Pt states pain is at a 3.

## 2018-04-18 LAB — AEROBIC CULTURE W GRAM STAIN (SUPERFICIAL SPECIMEN)

## 2018-04-18 LAB — AEROBIC CULTURE  (SUPERFICIAL SPECIMEN)

## 2018-04-23 LAB — ANAEROBIC CULTURE
